# Patient Record
Sex: Female | Born: 1935
Health system: Southern US, Community
[De-identification: ages and names within clinical notes are randomized; demographics above are authoritative.]

## PROBLEM LIST (undated history)

## (undated) DIAGNOSIS — Z8701 Personal history of pneumonia (recurrent): Secondary | ICD-10-CM

## (undated) DIAGNOSIS — H9190 Unspecified hearing loss, unspecified ear: Secondary | ICD-10-CM

## (undated) DIAGNOSIS — I10 Essential (primary) hypertension: Secondary | ICD-10-CM

## (undated) DIAGNOSIS — R200 Anesthesia of skin: Secondary | ICD-10-CM

## (undated) DIAGNOSIS — M1990 Unspecified osteoarthritis, unspecified site: Secondary | ICD-10-CM

## (undated) DIAGNOSIS — R32 Unspecified urinary incontinence: Secondary | ICD-10-CM

## (undated) DIAGNOSIS — C44712 Basal cell carcinoma of skin of right lower limb, including hip: Secondary | ICD-10-CM

## (undated) DIAGNOSIS — Z8709 Personal history of other diseases of the respiratory system: Secondary | ICD-10-CM

## (undated) HISTORY — PX: BASAL CELL CARCINOMA EXCISION: SHX1214

## (undated) HISTORY — DX: Anesthesia of skin: R20.0

## (undated) HISTORY — PX: TUBAL LIGATION: SHX77

---

## 2015-06-07 DIAGNOSIS — J069 Acute upper respiratory infection, unspecified: Secondary | ICD-10-CM | POA: Diagnosis not present

## 2015-07-02 DIAGNOSIS — J209 Acute bronchitis, unspecified: Secondary | ICD-10-CM | POA: Diagnosis not present

## 2015-07-30 DIAGNOSIS — I10 Essential (primary) hypertension: Secondary | ICD-10-CM | POA: Diagnosis not present

## 2015-07-30 DIAGNOSIS — M419 Scoliosis, unspecified: Secondary | ICD-10-CM | POA: Diagnosis not present

## 2015-07-30 DIAGNOSIS — H9190 Unspecified hearing loss, unspecified ear: Secondary | ICD-10-CM | POA: Diagnosis not present

## 2015-07-30 DIAGNOSIS — C4490 Unspecified malignant neoplasm of skin, unspecified: Secondary | ICD-10-CM | POA: Diagnosis not present

## 2015-07-30 DIAGNOSIS — M25511 Pain in right shoulder: Secondary | ICD-10-CM | POA: Diagnosis not present

## 2015-08-03 DIAGNOSIS — H2513 Age-related nuclear cataract, bilateral: Secondary | ICD-10-CM | POA: Diagnosis not present

## 2015-08-04 DIAGNOSIS — Z1283 Encounter for screening for malignant neoplasm of skin: Secondary | ICD-10-CM | POA: Diagnosis not present

## 2015-08-04 DIAGNOSIS — C44722 Squamous cell carcinoma of skin of right lower limb, including hip: Secondary | ICD-10-CM | POA: Diagnosis not present

## 2015-08-04 DIAGNOSIS — L821 Other seborrheic keratosis: Secondary | ICD-10-CM | POA: Diagnosis not present

## 2015-08-19 DIAGNOSIS — M25521 Pain in right elbow: Secondary | ICD-10-CM | POA: Diagnosis not present

## 2015-08-19 DIAGNOSIS — M19011 Primary osteoarthritis, right shoulder: Secondary | ICD-10-CM | POA: Diagnosis not present

## 2015-09-11 DIAGNOSIS — Z85828 Personal history of other malignant neoplasm of skin: Secondary | ICD-10-CM | POA: Diagnosis not present

## 2015-09-11 DIAGNOSIS — Z08 Encounter for follow-up examination after completed treatment for malignant neoplasm: Secondary | ICD-10-CM | POA: Diagnosis not present

## 2015-09-11 DIAGNOSIS — L980 Pyogenic granuloma: Secondary | ICD-10-CM | POA: Diagnosis not present

## 2015-11-02 DIAGNOSIS — M25511 Pain in right shoulder: Secondary | ICD-10-CM | POA: Diagnosis not present

## 2015-11-13 DIAGNOSIS — M19011 Primary osteoarthritis, right shoulder: Secondary | ICD-10-CM | POA: Diagnosis not present

## 2015-12-03 ENCOUNTER — Other Ambulatory Visit: Payer: Self-pay | Admitting: Orthopedic Surgery

## 2016-01-04 NOTE — Pre-Procedure Instructions (Signed)
Terry Nunez  01/04/2016      [id number] - [address], [place] [address] [place] [address] Phone: [phone] [phone]    Your procedure is scheduled on Thursday, September 21st, 2017.  Report to [facility] Admitting at 5:30 A.M.   Call this number if you have problems the morning of surgery:  [phone]   Remember:  Do not eat food or drink liquids after midnight.   Take these medicines the morning of surgery with A SIP OF WATER: None.  7 days prior to surgery, stop taking: Aspirin, NSAIDS, Aleve, Naproxen, Ibuprofen, Advil, Motrin, BC's, Goody's, Fish oil, all herbal medications, and all vitamins.    Do not wear jewelry, make-up or nail polish.  Do not wear lotions, powders, or perfumes, or deoderant.  Do not shave 48 hours prior to surgery.    Do not bring valuables to the hospital.  [facility] is not responsible for any belongings or valuables.  Contacts, dentures or bridgework may not be worn into surgery.  Leave your suitcase in the car.  After surgery it may be brought to your room.  For patients admitted to the hospital, discharge time will be determined by your treatment team.  Patients discharged the day of surgery will not be allowed to drive home.   Special instructions:  Preparing for Surgery.   Please read over the following fact sheets that you were given. MRSA Information, Incentive Spirometry   [place]- Preparing For Surgery  Before surgery, you can play an important role. Because skin is not sterile, your skin needs to be as free of germs as possible. You can reduce the number of germs on your skin by washing with CHG (chlorahexidine gluconate) Soap before surgery.  CHG is an antiseptic cleaner which kills germs and bonds with the skin to continue killing germs even after washing.  Please do not use if you have an allergy to CHG or antibacterial soaps. If your skin becomes  reddened/irritated stop using the CHG.  Do not shave (including legs and underarms) for at least 48 hours prior to first CHG shower. It is OK to shave your face.  Please follow these instructions carefully.   1. Shower the NIGHT BEFORE SURGERY and the MORNING OF SURGERY with CHG.   2. If you chose to wash your hair, wash your hair first as usual with your normal shampoo.  3. After you shampoo, rinse your hair and body thoroughly to remove the shampoo.  4. Use CHG as you would any other liquid soap. You can apply CHG directly to the skin and wash gently with a scrungie or a clean washcloth.   5. Apply the CHG Soap to your body ONLY FROM THE NECK DOWN.  Do not use on open wounds or open sores. Avoid contact with your eyes, ears, mouth and genitals (private parts). Wash genitals (private parts) with your normal soap.  6. Wash thoroughly, paying special attention to the area where your surgery will be performed.  7. Thoroughly rinse your body with warm water from the neck down.  8. DO NOT shower/wash with your normal soap after using and rinsing off the CHG Soap.  9. Pat yourself dry with a CLEAN TOWEL.   10. Wear CLEAN PAJAMAS   11. Place CLEAN SHEETS on your bed the night of your first shower and DO NOT SLEEP WITH PETS.  Day of Surgery: Do not apply any deodorants/lotions.  Please wear clean clothes to the hospital/surgery center.

## 2016-01-05 ENCOUNTER — Ambulatory Visit (HOSPITAL_COMMUNITY)
Admission: RE | Admit: 2016-01-05 | Discharge: 2016-01-05 | Disposition: A | Discharge status: Home / Self Care | Payer: Medicare Other | Source: Ambulatory Visit | Attending: Orthopedic Surgery | Admitting: Orthopedic Surgery

## 2016-01-05 ENCOUNTER — Encounter (HOSPITAL_COMMUNITY)
Admission: RE | Admit: 2016-01-05 | Discharge: 2016-01-05 | Disposition: A | Discharge status: Home / Self Care | Payer: Medicare Other | Source: Ambulatory Visit | Attending: Orthopedic Surgery | Admitting: Orthopedic Surgery

## 2016-01-05 ENCOUNTER — Encounter (HOSPITAL_COMMUNITY): Payer: Self-pay | Admitting: General Practice

## 2016-01-05 DIAGNOSIS — I10 Essential (primary) hypertension: Secondary | ICD-10-CM | POA: Insufficient documentation

## 2016-01-05 DIAGNOSIS — Z01812 Encounter for preprocedural laboratory examination: Secondary | ICD-10-CM | POA: Insufficient documentation

## 2016-01-05 DIAGNOSIS — Z01810 Encounter for preprocedural cardiovascular examination: Secondary | ICD-10-CM | POA: Insufficient documentation

## 2016-01-05 DIAGNOSIS — Z01818 Encounter for other preprocedural examination: Secondary | ICD-10-CM | POA: Diagnosis not present

## 2016-01-05 HISTORY — DX: Personal history of pneumonia (recurrent): Z87.01

## 2016-01-05 HISTORY — DX: Unspecified osteoarthritis, unspecified site: M19.90

## 2016-01-05 HISTORY — DX: Personal history of other diseases of the respiratory system: Z87.09

## 2016-01-05 HISTORY — DX: Unspecified hearing loss, unspecified ear: H91.90

## 2016-01-05 HISTORY — DX: Essential (primary) hypertension: I10

## 2016-01-05 HISTORY — DX: Unspecified urinary incontinence: R32

## 2016-01-05 HISTORY — DX: Basal cell carcinoma of skin of right lower limb, including hip: C44.712

## 2016-01-05 LAB — PROTIME-INR
INR: 0.98
Prothrombin Time: 13 seconds (ref 11.4–15.2)

## 2016-01-05 LAB — COMPREHENSIVE METABOLIC PANEL
ALBUMIN: 3.9 g/dL (ref 3.5–5.0)
ALT: 21 U/L (ref 14–54)
ANION GAP: 8 (ref 5–15)
AST: 28 U/L (ref 15–41)
Alkaline Phosphatase: 68 U/L (ref 38–126)
BUN: 18 mg/dL (ref 6–20)
CO2: 25 mmol/L (ref 22–32)
Calcium: 9.5 mg/dL (ref 8.9–10.3)
Chloride: 107 mmol/L (ref 101–111)
Creatinine, Ser: 0.63 mg/dL (ref 0.44–1.00)
GFR calc Af Amer: >60 mL/min (ref >60)
GFR calc non Af Amer: >60 mL/min (ref >60)
GLUCOSE: 90 mg/dL (ref 65–99)
POTASSIUM: 4.4 mmol/L (ref 3.5–5.1)
SODIUM: 140 mmol/L (ref 135–145)
Total Bilirubin: 0.9 mg/dL (ref 0.3–1.2)
Total Protein: 6.8 g/dL (ref 6.5–8.1)

## 2016-01-05 LAB — URINALYSIS, ROUTINE W REFLEX MICROSCOPIC
BILIRUBIN URINE: NEGATIVE
Glucose, UA: NEGATIVE mg/dL
HGB URINE DIPSTICK: NEGATIVE
KETONES UR: NEGATIVE mg/dL
Leukocytes, UA: NEGATIVE
Nitrite: NEGATIVE
PROTEIN: NEGATIVE mg/dL
pH: 6 (ref 5.0–8.0)

## 2016-01-05 LAB — SURGICAL PCR SCREEN
MRSA, PCR: NEGATIVE
STAPHYLOCOCCUS AUREUS: POSITIVE — AB

## 2016-01-05 LAB — CBC WITH DIFFERENTIAL/PLATELET
BASOS ABS: 0 10*3/uL (ref 0.0–0.1)
BASOS PCT: 1 %
EOS ABS: 0.4 10*3/uL (ref 0.0–0.7)
Eosinophils Relative: 5 %
HCT: 48 % — ABNORMAL HIGH (ref 36.0–46.0)
HEMOGLOBIN: 15.5 g/dL — AB (ref 12.0–15.0)
Lymphocytes Relative: 28 %
Lymphs Abs: 2 10*3/uL (ref 0.7–4.0)
MCH: 29.9 pg (ref 26.0–34.0)
MCHC: 32.3 g/dL (ref 30.0–36.0)
MCV: 92.7 fL (ref 78.0–100.0)
MONO ABS: 0.6 10*3/uL (ref 0.1–1.0)
MONOS PCT: 8 %
NEUTROS ABS: 4 10*3/uL (ref 1.7–7.7)
NEUTROS PCT: 58 %
Platelets: 171 10*3/uL (ref 150–400)
RBC: 5.18 MIL/uL — ABNORMAL HIGH (ref 3.87–5.11)
RDW: 13.6 % (ref 11.5–15.5)
WBC: 7 10*3/uL (ref 4.0–10.5)

## 2016-01-05 LAB — TYPE AND SCREEN
ABO/RH(D): A POS
ANTIBODY SCREEN: NEGATIVE

## 2016-01-05 LAB — ABO/RH: ABO/RH(D): A POS

## 2016-01-05 LAB — APTT: APTT: 30 s (ref 24–36)

## 2016-01-05 NOTE — Progress Notes (Signed)
Patient notified of positive PCR and verbalized understanding.  Prescription called to [facility] on [address].

## 2016-01-05 NOTE — Progress Notes (Addendum)
PCP - Dr. Yaakov Guthrie Cardiologist - denies  EKG - 01/05/16    -requested EKG for comparison but patient stated that she does not think she has had an EKG in the past CXR - 01/05/16  Echo/stress test/Cardiac Cath - denies  Patient denies chest pain and shortness of breath at PAT appointment.

## 2016-01-07 NOTE — Progress Notes (Signed)
Anesthesia Chart Review:  Pt is a 82 year old female scheduled for R reverse shoulder arthroplasty on 01/14/2016 with Tania Ade, MD.   PMH includes:  HTN. Never smoker. BMI 26  Medications include: ASA, irbesartan  Preoperative labs reviewed.    CXR 01/05/16: No acute cardiopulmonary disease.  EKG 01/05/16: Sinus bradycardia (57 bpm). Incomplete RBBB. Possible Anterior infarct, age undetermined  Reviewed case with Dr. Kalman Shan. If no changes, I anticipate pt can proceed with surgery as scheduled.   Willeen Cass, FNP-BC [facility]/Anesthesiology Phone: [phone] 01/07/2016 4:50 PM

## 2016-01-13 MED ORDER — CEFAZOLIN SODIUM-DEXTROSE 2-4 GM/100ML-% IV SOLN
2.0000 g | INTRAVENOUS | Status: AC
  Administered 2016-01-14: 2 g via INTRAVENOUS
  Filled 2016-01-13: qty 100

## 2016-01-14 ENCOUNTER — Inpatient Hospital Stay (HOSPITAL_COMMUNITY): Payer: Medicare Other | Admitting: Anesthesiology

## 2016-01-14 ENCOUNTER — Inpatient Hospital Stay (HOSPITAL_COMMUNITY): Payer: Medicare Other

## 2016-01-14 ENCOUNTER — Inpatient Hospital Stay (HOSPITAL_COMMUNITY)
Admission: RE | Admit: 2016-01-14 | Discharge: 2016-01-15 | DRG: 483 | Disposition: A | Discharge status: Home / Self Care | Payer: Medicare Other | Source: Ambulatory Visit | Attending: Orthopedic Surgery | Admitting: Orthopedic Surgery

## 2016-01-14 ENCOUNTER — Encounter (HOSPITAL_COMMUNITY)
Admission: RE | Disposition: A | Discharge status: Home / Self Care | Payer: Self-pay | Source: Ambulatory Visit | Attending: Orthopedic Surgery

## 2016-01-14 ENCOUNTER — Encounter (HOSPITAL_COMMUNITY): Payer: Self-pay | Admitting: Surgery

## 2016-01-14 ENCOUNTER — Inpatient Hospital Stay (HOSPITAL_COMMUNITY): Payer: Medicare Other | Admitting: Emergency Medicine

## 2016-01-14 DIAGNOSIS — Z7982 Long term (current) use of aspirin: Secondary | ICD-10-CM

## 2016-01-14 DIAGNOSIS — G8918 Other acute postprocedural pain: Secondary | ICD-10-CM | POA: Diagnosis not present

## 2016-01-14 DIAGNOSIS — Z96611 Presence of right artificial shoulder joint: Secondary | ICD-10-CM | POA: Diagnosis not present

## 2016-01-14 DIAGNOSIS — Z85828 Personal history of other malignant neoplasm of skin: Secondary | ICD-10-CM

## 2016-01-14 DIAGNOSIS — I10 Essential (primary) hypertension: Secondary | ICD-10-CM | POA: Diagnosis present

## 2016-01-14 DIAGNOSIS — M25511 Pain in right shoulder: Secondary | ICD-10-CM | POA: Diagnosis not present

## 2016-01-14 DIAGNOSIS — M19011 Primary osteoarthritis, right shoulder: Secondary | ICD-10-CM | POA: Diagnosis not present

## 2016-01-14 DIAGNOSIS — H9190 Unspecified hearing loss, unspecified ear: Secondary | ICD-10-CM | POA: Diagnosis not present

## 2016-01-14 DIAGNOSIS — Z471 Aftercare following joint replacement surgery: Secondary | ICD-10-CM | POA: Diagnosis not present

## 2016-01-14 DIAGNOSIS — Z96619 Presence of unspecified artificial shoulder joint: Secondary | ICD-10-CM

## 2016-01-14 DIAGNOSIS — M75121 Complete rotator cuff tear or rupture of right shoulder, not specified as traumatic: Secondary | ICD-10-CM | POA: Diagnosis not present

## 2016-01-14 HISTORY — DX: Presence of unspecified artificial shoulder joint: Z96.619

## 2016-01-14 HISTORY — PX: REVERSE SHOULDER ARTHROPLASTY: SHX5054

## 2016-01-14 SURGERY — ARTHROPLASTY, SHOULDER, TOTAL, REVERSE
Anesthesia: Regional | Site: Shoulder | Laterality: Right

## 2016-01-14 MED ORDER — ACETAMINOPHEN 325 MG PO TABS
650.0000 mg | ORAL_TABLET | Freq: Four times a day (QID) | ORAL | Status: DC | PRN
  Administered 2016-01-14: 650 mg via ORAL

## 2016-01-14 MED ORDER — ROCURONIUM BROMIDE 10 MG/ML (PF) SYRINGE
PREFILLED_SYRINGE | INTRAVENOUS | Status: AC
  Filled 2016-01-14: qty 20

## 2016-01-14 MED ORDER — EPHEDRINE SULFATE 50 MG/ML IJ SOLN
INTRAMUSCULAR | Status: DC | PRN
  Administered 2016-01-14 (×2): 10 mg via INTRAVENOUS

## 2016-01-14 MED ORDER — SODIUM CHLORIDE 0.9 % IR SOLN
Status: DC | PRN
  Administered 2016-01-14: 4000 mL

## 2016-01-14 MED ORDER — ACETAMINOPHEN 650 MG RE SUPP: 650.0000 mg | Freq: Four times a day (QID) | RECTAL | Status: DC | PRN

## 2016-01-14 MED ORDER — FENTANYL CITRATE (PF) 100 MCG/2ML IJ SOLN
INTRAMUSCULAR | Status: DC | PRN
  Administered 2016-01-14 (×2): 50 ug via INTRAVENOUS

## 2016-01-14 MED ORDER — OXYCODONE HCL 5 MG PO TABS
5.0000 mg | ORAL_TABLET | ORAL | Status: DC | PRN
  Administered 2016-01-14: 10 mg via ORAL

## 2016-01-14 MED ORDER — POLYETHYLENE GLYCOL 3350 17 G PO PACK: 17.0000 g | PACK | Freq: Every day | ORAL | Status: DC | PRN

## 2016-01-14 MED ORDER — SUGAMMADEX SODIUM 200 MG/2ML IV SOLN
INTRAVENOUS | Status: AC
  Filled 2016-01-14: qty 2

## 2016-01-14 MED ORDER — DEXTROSE 5 % IV SOLN
INTRAVENOUS | Status: DC | PRN
  Administered 2016-01-14: 08:00:00 via INTRAVENOUS

## 2016-01-14 MED ORDER — FENTANYL CITRATE (PF) 100 MCG/2ML IJ SOLN: 25.0000 ug | INTRAMUSCULAR | Status: DC | PRN

## 2016-01-14 MED ORDER — ONDANSETRON HCL 4 MG/2ML IJ SOLN
INTRAMUSCULAR | Status: AC
  Filled 2016-01-14: qty 2

## 2016-01-14 MED ORDER — DEXAMETHASONE SODIUM PHOSPHATE 10 MG/ML IJ SOLN
INTRAMUSCULAR | Status: DC | PRN
  Administered 2016-01-14: 5 mg via INTRAVENOUS

## 2016-01-14 MED ORDER — DEXAMETHASONE SODIUM PHOSPHATE 10 MG/ML IJ SOLN
INTRAMUSCULAR | Status: AC
  Filled 2016-01-14: qty 1

## 2016-01-14 MED ORDER — MIDAZOLAM HCL 5 MG/5ML IJ SOLN
INTRAMUSCULAR | Status: DC | PRN
  Administered 2016-01-14 (×2): 1 mg via INTRAVENOUS

## 2016-01-14 MED ORDER — FLEET ENEMA 7-19 GM/118ML RE ENEM: 1.0000 | ENEMA | Freq: Once | RECTAL | Status: DC | PRN

## 2016-01-14 MED ORDER — TRANEXAMIC ACID 1000 MG/10ML IV SOLN
1000.0000 mg | INTRAVENOUS | Status: AC
  Administered 2016-01-14: 1000 mg via INTRAVENOUS
  Filled 2016-01-14: qty 10

## 2016-01-14 MED ORDER — ONDANSETRON HCL 4 MG/2ML IJ SOLN
INTRAMUSCULAR | Status: DC | PRN
  Administered 2016-01-14: 4 mg via INTRAVENOUS

## 2016-01-14 MED ORDER — OXYCODONE HCL 5 MG PO TABS: 5.0000 mg | ORAL_TABLET | Freq: Once | ORAL | Status: DC | PRN

## 2016-01-14 MED ORDER — ARTIFICIAL TEARS OP OINT
TOPICAL_OINTMENT | OPHTHALMIC | Status: AC
  Filled 2016-01-14: qty 7

## 2016-01-14 MED ORDER — OXYCODONE HCL 5 MG/5ML PO SOLN: 5.0000 mg | Freq: Once | ORAL | Status: DC | PRN

## 2016-01-14 MED ORDER — OXYCODONE HCL 5 MG PO TABS
ORAL_TABLET | ORAL | Status: AC
  Filled 2016-01-14: qty 2

## 2016-01-14 MED ORDER — ONDANSETRON HCL 4 MG PO TABS: 4.0000 mg | ORAL_TABLET | Freq: Four times a day (QID) | ORAL | Status: DC | PRN

## 2016-01-14 MED ORDER — DOCUSATE SODIUM 100 MG PO CAPS
100.0000 mg | ORAL_CAPSULE | Freq: Two times a day (BID) | ORAL | Status: DC
  Administered 2016-01-15: 100 mg via ORAL
  Filled 2016-01-14 (×2): qty 1

## 2016-01-14 MED ORDER — MORPHINE SULFATE (PF) 2 MG/ML IV SOLN: 1.0000 mg | INTRAVENOUS | Status: DC | PRN

## 2016-01-14 MED ORDER — LIDOCAINE 2% (20 MG/ML) 5 ML SYRINGE
INTRAMUSCULAR | Status: AC
  Filled 2016-01-14: qty 5

## 2016-01-14 MED ORDER — POTASSIUM CHLORIDE IN NACL 20-0.45 MEQ/L-% IV SOLN
INTRAVENOUS | Status: DC
  Filled 2016-01-14 (×2): qty 1000

## 2016-01-14 MED ORDER — ASPIRIN EC 325 MG PO TBEC
325.0000 mg | DELAYED_RELEASE_TABLET | Freq: Every day | ORAL | Status: DC
  Administered 2016-01-15: 325 mg via ORAL
  Filled 2016-01-14: qty 1

## 2016-01-14 MED ORDER — LACTATED RINGERS IV SOLN
INTRAVENOUS | Status: DC | PRN
  Administered 2016-01-14: 07:00:00 via INTRAVENOUS

## 2016-01-14 MED ORDER — MENTHOL 3 MG MT LOZG: 1.0000 | LOZENGE | OROMUCOSAL | Status: DC | PRN

## 2016-01-14 MED ORDER — METOCLOPRAMIDE HCL 5 MG PO TABS
5.0000 mg | ORAL_TABLET | Freq: Three times a day (TID) | ORAL | Status: DC | PRN
  Filled 2016-01-14: qty 2

## 2016-01-14 MED ORDER — MIDAZOLAM HCL 2 MG/2ML IJ SOLN
INTRAMUSCULAR | Status: AC
  Filled 2016-01-14: qty 2

## 2016-01-14 MED ORDER — ALUM & MAG HYDROXIDE-SIMETH 200-200-20 MG/5ML PO SUSP: 30.0000 mL | ORAL | Status: DC | PRN

## 2016-01-14 MED ORDER — PROPOFOL 10 MG/ML IV BOLUS
INTRAVENOUS | Status: AC
  Filled 2016-01-14: qty 40

## 2016-01-14 MED ORDER — PROPOFOL 10 MG/ML IV BOLUS
INTRAVENOUS | Status: DC | PRN
Start: 2016-01-14 — End: 2016-01-14
  Administered 2016-01-14: 100 mg via INTRAVENOUS

## 2016-01-14 MED ORDER — ACETAMINOPHEN 325 MG PO TABS
ORAL_TABLET | ORAL | Status: AC
  Filled 2016-01-14: qty 2

## 2016-01-14 MED ORDER — PHENYLEPHRINE 40 MCG/ML (10ML) SYRINGE FOR IV PUSH (FOR BLOOD PRESSURE SUPPORT)
PREFILLED_SYRINGE | INTRAVENOUS | Status: AC
  Filled 2016-01-14: qty 10

## 2016-01-14 MED ORDER — IRBESARTAN 150 MG PO TABS
150.0000 mg | ORAL_TABLET | Freq: Every day | ORAL | Status: DC
  Administered 2016-01-14 – 2016-01-15 (×2): 150 mg via ORAL
  Filled 2016-01-14 (×2): qty 1

## 2016-01-14 MED ORDER — POVIDONE-IODINE 7.5 % EX SOLN: Freq: Once | CUTANEOUS | Status: DC

## 2016-01-14 MED ORDER — SUGAMMADEX SODIUM 200 MG/2ML IV SOLN
INTRAVENOUS | Status: DC | PRN
  Administered 2016-01-14: 200 mg via INTRAVENOUS

## 2016-01-14 MED ORDER — ONDANSETRON HCL 4 MG/2ML IJ SOLN: 4.0000 mg | Freq: Four times a day (QID) | INTRAMUSCULAR | Status: DC | PRN

## 2016-01-14 MED ORDER — PHENYLEPHRINE HCL 10 MG/ML IJ SOLN
INTRAMUSCULAR | Status: DC | PRN
  Administered 2016-01-14: 10 ug/min via INTRAVENOUS

## 2016-01-14 MED ORDER — DIPHENHYDRAMINE HCL 12.5 MG/5ML PO ELIX
12.5000 mg | ORAL_SOLUTION | ORAL | Status: DC | PRN
Start: 2016-01-14 — End: 2016-01-15
  Filled 2016-01-14: qty 10

## 2016-01-14 MED ORDER — FENTANYL CITRATE (PF) 100 MCG/2ML IJ SOLN
INTRAMUSCULAR | Status: AC
  Filled 2016-01-14: qty 2

## 2016-01-14 MED ORDER — PHENOL 1.4 % MT LIQD: 1.0000 | OROMUCOSAL | Status: DC | PRN

## 2016-01-14 MED ORDER — BISACODYL 5 MG PO TBEC: 5.0000 mg | DELAYED_RELEASE_TABLET | Freq: Every day | ORAL | Status: DC | PRN

## 2016-01-14 MED ORDER — ROCURONIUM BROMIDE 100 MG/10ML IV SOLN
INTRAVENOUS | Status: DC | PRN
  Administered 2016-01-14: 40 mg via INTRAVENOUS

## 2016-01-14 MED ORDER — LIDOCAINE HCL (CARDIAC) 20 MG/ML IV SOLN
INTRAVENOUS | Status: DC | PRN
  Administered 2016-01-14: 80 mg via INTRAVENOUS

## 2016-01-14 MED ORDER — METOCLOPRAMIDE HCL 5 MG/ML IJ SOLN
5.0000 mg | Freq: Three times a day (TID) | INTRAMUSCULAR | Status: DC | PRN
  Filled 2016-01-14: qty 2

## 2016-01-14 MED ORDER — BUPIVACAINE-EPINEPHRINE (PF) 0.5% -1:200000 IJ SOLN
INTRAMUSCULAR | Status: DC | PRN
  Administered 2016-01-14: 30 mL via PERINEURAL

## 2016-01-14 MED ORDER — EPHEDRINE 5 MG/ML INJ
INTRAVENOUS | Status: AC
  Filled 2016-01-14: qty 10

## 2016-01-14 MED ORDER — ARTIFICIAL TEARS OP OINT
TOPICAL_OINTMENT | OPHTHALMIC | Status: DC | PRN
  Administered 2016-01-14: 1 via OPHTHALMIC

## 2016-01-14 MED ORDER — CEFAZOLIN IN D5W 1 GM/50ML IV SOLN
1.0000 g | Freq: Four times a day (QID) | INTRAVENOUS | Status: AC
  Administered 2016-01-14 – 2016-01-15 (×3): 1 g via INTRAVENOUS
  Filled 2016-01-14 (×3): qty 50

## 2016-01-14 MED ORDER — ACETAMINOPHEN 500 MG PO TABS
1000.0000 mg | ORAL_TABLET | Freq: Four times a day (QID) | ORAL | Status: DC
  Administered 2016-01-14 – 2016-01-15 (×3): 1000 mg via ORAL
  Filled 2016-01-14 (×3): qty 2

## 2016-01-14 MED ORDER — SUCCINYLCHOLINE CHLORIDE 200 MG/10ML IV SOSY
PREFILLED_SYRINGE | INTRAVENOUS | Status: AC
  Filled 2016-01-14: qty 10

## 2016-01-14 SURGICAL SUPPLY — 78 items
BASEPLATE P2 COATD GLND 6.5X30 (Shoulder) ×1 IMPLANT
BIT DRILL 5/64X5 DISP (BIT) ×2 IMPLANT
BLADE SAW SAG 73X25 THK (BLADE) ×1
BLADE SAW SGTL 73X25 THK (BLADE) ×1 IMPLANT
BLADE SURG 15 STRL LF DISP TIS (BLADE) ×1 IMPLANT
BLADE SURG 15 STRL SS (BLADE) ×1
BOWL SMART MIX CTS (DISPOSABLE) IMPLANT
CHLORAPREP W/TINT 26ML (MISCELLANEOUS) ×2 IMPLANT
COVER MAYO STAND STRL (DRAPES) IMPLANT
COVER SURGICAL LIGHT HANDLE (MISCELLANEOUS) ×2 IMPLANT
DRAPE INCISE IOBAN 66X45 STRL (DRAPES) ×2 IMPLANT
DRAPE ORTHO SPLIT 77X108 STRL (DRAPES) ×2
DRAPE SURG ORHT 6 SPLT 77X108 (DRAPES) ×2 IMPLANT
DRSG AQUACEL AG ADV 3.5X10 (GAUZE/BANDAGES/DRESSINGS) ×2 IMPLANT
ELECT BLADE 4.0 EZ CLEAN MEGAD (MISCELLANEOUS) ×2
ELECT REM PT RETURN 9FT ADLT (ELECTROSURGICAL) ×2
ELECTRODE BLDE 4.0 EZ CLN MEGD (MISCELLANEOUS) ×1 IMPLANT
ELECTRODE REM PT RTRN 9FT ADLT (ELECTROSURGICAL) ×1 IMPLANT
EVACUATOR 1/8 PVC DRAIN (DRAIN) IMPLANT
GLOVE BIO SURGEON STRL SZ7 (GLOVE) ×2 IMPLANT
GLOVE BIO SURGEON STRL SZ7.5 (GLOVE) ×2 IMPLANT
GLOVE BIOGEL PI IND STRL 7.0 (GLOVE) ×1 IMPLANT
GLOVE BIOGEL PI IND STRL 8 (GLOVE) ×1 IMPLANT
GLOVE BIOGEL PI INDICATOR 7.0 (GLOVE) ×1
GLOVE BIOGEL PI INDICATOR 8 (GLOVE) ×1
GOWN STRL REUS W/ TWL LRG LVL3 (GOWN DISPOSABLE) ×3 IMPLANT
GOWN STRL REUS W/ TWL XL LVL3 (GOWN DISPOSABLE) ×1 IMPLANT
GOWN STRL REUS W/TWL LRG LVL3 (GOWN DISPOSABLE) ×3
GOWN STRL REUS W/TWL XL LVL3 (GOWN DISPOSABLE) ×1
HANDPIECE INTERPULSE COAX TIP (DISPOSABLE) ×1
HOOD PEEL AWAY FLYTE STAYCOOL (MISCELLANEOUS) ×4 IMPLANT
INSERT EPOLY STND HUMERUS+4 32 (Shoulder) ×2 IMPLANT
INSERT EPOLYSTD HUMERUS+4 32 (Shoulder) ×1 IMPLANT
KIT BASIN OR (CUSTOM PROCEDURE TRAY) ×2 IMPLANT
KIT ROOM TURNOVER OR (KITS) ×2 IMPLANT
MANIFOLD NEPTUNE II (INSTRUMENTS) ×2 IMPLANT
NEEDLE HYPO 25GX1X1/2 BEV (NEEDLE) IMPLANT
NEEDLE MAYO TROCAR (NEEDLE) IMPLANT
NOZZLE PRISM 8.5MM (MISCELLANEOUS) IMPLANT
NS IRRIG 1000ML POUR BTL (IV SOLUTION) ×2 IMPLANT
P2 COATDE GLNOID BSEPLT 6.5X30 (Shoulder) ×2 IMPLANT
PACK SHOULDER (CUSTOM PROCEDURE TRAY) ×2 IMPLANT
PAD ARMBOARD 7.5X6 YLW CONV (MISCELLANEOUS) ×4 IMPLANT
RESTRAINT HEAD UNIVERSAL NS (MISCELLANEOUS) ×2 IMPLANT
RETRIEVER SUT HEWSON (MISCELLANEOUS) ×2 IMPLANT
SCREW BONE LOCKING RSP 5.0X14 (Screw) ×2 IMPLANT
SCREW BONE LOCKING RSP 5.0X30 (Screw) ×2 IMPLANT
SCREW BONE RSP LOCK 5X14 (Screw) ×1 IMPLANT
SCREW BONE RSP LOCK 5X18 (Screw) ×1 IMPLANT
SCREW BONE RSP LOCK 5X26 (Screw) ×1 IMPLANT
SCREW BONE RSP LOCK 5X30 (Screw) ×1 IMPLANT
SCREW BONE RSP LOCKING 18MM LG (Screw) ×2 IMPLANT
SCREW BONE RSP LOCKING 5.0X26 (Screw) ×2 IMPLANT
SCREW RETAIN W/HEAD 4MM OFFSET (Shoulder) ×2 IMPLANT
SET HNDPC FAN SPRY TIP SCT (DISPOSABLE) ×1 IMPLANT
SLING ARM IMMOBILIZER LRG (SOFTGOODS) ×2 IMPLANT
SLING ARM IMMOBILIZER MED (SOFTGOODS) IMPLANT
SPONGE LAP 18X18 X RAY DECT (DISPOSABLE) ×2 IMPLANT
SPONGE LAP 4X18 X RAY DECT (DISPOSABLE) IMPLANT
STEM REV PRIMARY 8X108 (Shoulder) ×2 IMPLANT
STRIP CLOSURE SKIN 1/2X4 (GAUZE/BANDAGES/DRESSINGS) ×2 IMPLANT
SUCTION FRAZIER HANDLE 10FR (MISCELLANEOUS) ×1
SUCTION TUBE FRAZIER 10FR DISP (MISCELLANEOUS) ×1 IMPLANT
SUPPORT WRAP ARM LG (MISCELLANEOUS) ×2 IMPLANT
SUT ETHIBOND NAB CT1 #1 30IN (SUTURE) ×4 IMPLANT
SUT MNCRL AB 4-0 PS2 18 (SUTURE) ×2 IMPLANT
SUT SILK 2 0 TIES 17X18 (SUTURE)
SUT SILK 2-0 18XBRD TIE BLK (SUTURE) IMPLANT
SUT VIC AB 0 CTB1 27 (SUTURE) ×2 IMPLANT
SUT VIC AB 2-0 CT1 27 (SUTURE) ×1
SUT VIC AB 2-0 CT1 TAPERPNT 27 (SUTURE) ×1 IMPLANT
SYR CONTROL 10ML LL (SYRINGE) IMPLANT
SYRINGE TOOMEY DISP (SYRINGE) IMPLANT
TAPE FIBER 2MM 7IN #2 BLUE (SUTURE) IMPLANT
TOWEL OR 17X24 6PK STRL BLUE (TOWEL DISPOSABLE) ×2 IMPLANT
TOWEL OR 17X26 10 PK STRL BLUE (TOWEL DISPOSABLE) ×2 IMPLANT
WATER STERILE IRR 1000ML POUR (IV SOLUTION) ×2 IMPLANT
YANKAUER SUCT BULB TIP NO VENT (SUCTIONS) IMPLANT

## 2016-01-14 NOTE — Anesthesia Postprocedure Evaluation (Deleted)
Anesthesia Post Note  Patient: Terry Nunez  Procedure(s) Performed: Procedure(s) (LRB): REVERSE SHOULDER ARTHROPLASTY (Right)  Anesthesia Post Evaluation  Last Vitals:  Vitals:   01/14/16 1332 01/14/16 1610  BP: 134/77 125/79  Pulse: (!) 58 67  Resp: 20 20  Temp: 36.4 C 36.8 C    Last Pain:  Vitals:   01/14/16 1100  TempSrc:   PainSc: 0-No pain                 Madelynn Malson S

## 2016-01-14 NOTE — Anesthesia Postprocedure Evaluation (Signed)
Anesthesia Post Note  Patient: Terry Nunez  Procedure(s) Performed: Procedure(s) (LRB): REVERSE SHOULDER ARTHROPLASTY (Right)  Patient location during evaluation: PACU Anesthesia Type: General Level of consciousness: awake and alert and patient cooperative Pain management: pain level controlled Vital Signs Assessment: post-procedure vital signs reviewed and stable Respiratory status: spontaneous breathing and respiratory function stable Cardiovascular status: stable Anesthetic complications: no    Last Vitals:  Vitals:   01/14/16 1332 01/14/16 1610  BP: 134/77 125/79  Pulse: (!) 58 67  Resp: 20 20  Temp: 36.4 C 36.8 C    Last Pain:  Vitals:   01/14/16 1100  TempSrc:   PainSc: 0-No pain                 Danee Soller S

## 2016-01-14 NOTE — Anesthesia Preprocedure Evaluation (Signed)
Anesthesia Evaluation  Patient identified by MRN, date of birth, ID band Patient awake    Reviewed: Allergy & Precautions, H&P , NPO status , Patient's Chart, lab work & pertinent test results  Airway Mallampati: II   Neck ROM: full    Dental   Pulmonary neg pulmonary ROS,    breath sounds clear to auscultation       Cardiovascular hypertension,  Rhythm:regular Rate:Normal     Neuro/Psych    GI/Hepatic   Endo/Other    Renal/GU      Musculoskeletal  (+) Arthritis ,   Abdominal   Peds  Hematology   Anesthesia Other Findings   Reproductive/Obstetrics                             Anesthesia Physical Anesthesia Plan  ASA: II  Anesthesia Plan: General and Regional   Post-op Pain Management:  Regional for Post-op pain   Induction: Intravenous  Airway Management Planned: Oral ETT  Additional Equipment:   Intra-op Plan:   Post-operative Plan:   Informed Consent: I have reviewed the patients History and Physical, chart, labs and discussed the procedure including the risks, benefits and alternatives for the proposed anesthesia with the patient or authorized representative who has indicated his/her understanding and acceptance.     Plan Discussed with: CRNA, Anesthesiologist and Surgeon  Anesthesia Plan Comments:         Anesthesia Quick Evaluation

## 2016-01-14 NOTE — Discharge Instructions (Signed)

## 2016-01-14 NOTE — OR Nursing (Signed)
Spoke w/ Danielle PA and pt okay to go to [facility] if needed.

## 2016-01-14 NOTE — Anesthesia Procedure Notes (Signed)
Anesthesia Regional Block:  Interscalene brachial plexus block  Pre-Anesthetic Checklist: ,, timeout performed, Correct Patient, Correct Site, Correct Laterality, Correct Procedure, Correct Position, site marked, Risks and benefits discussed,  Surgical consent,  Pre-op evaluation,  At surgeon's request and post-op pain management  Laterality: Right  Prep: chloraprep       Needles:  Injection technique: Single-shot  Needle Type: Echogenic Stimulator Needle     Needle Length: 5cm 5 cm Needle Gauge: 22 and 22 G    Additional Needles:  Procedures: ultrasound guided (picture in chart) and nerve stimulator Interscalene brachial plexus block  Nerve Stimulator or Paresthesia:  Response: biceps flexion, 0.45 mA,   Additional Responses:   Narrative:  Start time: 01/14/2016 7:02 AM End time: 01/14/2016 7:13 AM Injection made incrementally with aspirations every 5 mL.  Performed by: Personally  Anesthesiologist: Quinton Voth  Additional Notes: Functioning IV was confirmed and monitors were applied.  A 24mm 22ga Arrow echogenic stimulator needle was used. Sterile prep and drape,hand hygiene and sterile gloves were used.  Negative aspiration and negative test dose prior to incremental administration of local anesthetic. The patient tolerated the procedure well.  Ultrasound guidance: relevent anatomy identified, needle position confirmed, local anesthetic spread visualized around nerve(s), vascular puncture avoided.  Image printed for medical record.

## 2016-01-14 NOTE — H&P (Signed)
Terry Nunez is an 82 y.o. female.   Chief Complaint: R shoulder pain and dysfunction HPI: R shoulder endstage arthritis with significant rotator cuff pathology.  Failed nonoperative treatment.  Past Medical History:  Diagnosis Date  . Arthritis   . Basal cell carcinoma of right lower leg   . Hard of hearing   . History of bronchitis   . History of pneumonia   . Hypertension   . Urinary incontinence    "very little"    Past Surgical History:  Procedure Laterality Date  . BASAL CELL CARCINOMA EXCISION     right leg  . TUBAL LIGATION      History reviewed. No pertinent family history. Social History:  reports that she has never smoked. She has never used smokeless tobacco. She reports that she drinks alcohol. She reports that she does not use drugs.  Allergies:  Allergies  Allergen Reactions  . Aleve [Naproxen Sodium]     "causes my heart rate to drop"  . Codeine Rash    Medications Prior to Admission  Medication Sig Dispense Refill  . aspirin EC 81 MG tablet Take 81 mg by mouth daily.    . irbesartan (AVAPRO) 150 MG tablet Take 150 mg by mouth daily.    . Multiple Vitamin (MULTIVITAMIN WITH MINERALS) TABS tablet Take 1 tablet by mouth daily.    . Omega-3 Fatty Acids (FISH OIL PO) Take 1 capsule by mouth daily.      No results found for this or any previous visit (from the past 48 hour(s)). No results found.  Review of Systems  All other systems reviewed and are negative.   Blood pressure 131/73, pulse (!) 59, temperature 98.3 F (36.8 C), temperature source Oral, resp. rate 20, height 5' 2.5" (1.588 m), weight 65.3 kg (144 lb), SpO2 95 %. Physical Exam  Constitutional: She is oriented to person, place, and time. She appears well-developed and well-nourished.  HENT:  Head: Atraumatic.  Eyes: EOM are normal.  Cardiovascular: Intact distal pulses.   Respiratory: Effort normal.  Musculoskeletal:  R shoulder pain with limited ROM.  Neurological: She is alert  and oriented to person, place, and time.  Skin: Skin is warm and dry.  Psychiatric: She has a normal mood and affect.     Assessment/Plan R shoulder endstage arthritis with significant rotator cuff pathology.  Failed nonoperative treatment. Plan R reverse TSA Risks / benefits of surgery discussed Consent on chart  NPO for OR Preop antibiotics   Nita Sells, MD 01/14/2016, 7:15 AM

## 2016-01-14 NOTE — Transfer of Care (Signed)
Immediate Anesthesia Transfer of Care Note  Patient: Terry Nunez  Procedure(s) Performed: Procedure(s) with comments: REVERSE SHOULDER ARTHROPLASTY (Right) - Right reverse shoulder arthroplasty  Patient Location: PACU  Anesthesia Type:GA combined with regional for post-op pain  Level of Consciousness: awake, oriented, sedated, patient cooperative and responds to stimulation  Airway & Oxygen Therapy: Patient Spontanous Breathing and Patient connected to nasal cannula oxygen  Post-op Assessment: Report given to RN, Post -op Vital signs reviewed and stable, Patient moving all extremities and Patient moving all extremities X 4  Post vital signs: Reviewed and stable  Last Vitals:  Vitals:   01/14/16 0600  BP: 131/73  Pulse: (!) 59  Resp: 20  Temp: 36.8 C    Last Pain:  Vitals:   01/14/16 0600  TempSrc: Oral         Complications: No apparent anesthesia complications

## 2016-01-14 NOTE — Anesthesia Procedure Notes (Signed)
Procedure Name: Intubation Date/Time: 01/14/2016 7:43 AM Performed by: Jacquiline Doe A Pre-anesthesia Checklist: Patient identified, Emergency Drugs available, Suction available and Patient being monitored Patient Re-evaluated:Patient Re-evaluated prior to inductionOxygen Delivery Method: Circle System Utilized and Circle system utilized Preoxygenation: Pre-oxygenation with 100% oxygen Intubation Type: IV induction and Cricoid Pressure applied Ventilation: Mask ventilation without difficulty Laryngoscope Size: Mac and 3 Grade View: Grade I Tube type: Oral Tube size: 7.5 mm Number of attempts: 1 Airway Equipment and Method: Stylet and Oral airway Placement Confirmation: ETT inserted through vocal cords under direct vision,  positive ETCO2 and breath sounds checked- equal and bilateral Secured at: 22 cm Tube secured with: Tape Dental Injury: Teeth and Oropharynx as per pre-operative assessment

## 2016-01-14 NOTE — Op Note (Signed)
Procedure(s): REVERSE SHOULDER ARTHROPLASTY Procedure Note  Terry Nunez female 82 y.o. 01/14/2016  Procedure(s) and Anesthesia Type:    * R REVERSE SHOULDER ARTHROPLASTY - Choice   Indications:  82 y.o. female  With endstage right shoulder arthritis with rotator cuff tear. Pain and dysfunction interfered with quality of life and nonoperative treatment with activity modification, NSAIDS and injections failed.     Surgeon: Nita Sells   Assistants: Jeanmarie Hubert PA-C [facility] was present and scrubbed throughout the procedure and was essential in positioning, retraction, exposure, and closure)  Anesthesia: General endotracheal anesthesia with preoperative interscalene block given by the attending anesthesiologist   Procedure Detail  REVERSE SHOULDER ARTHROPLASTY   Estimated Blood Loss:  200 mL         Drains: none  Blood Given: none          Specimens: none        Complications:  * No complications entered in OR log *         Disposition: PACU - hemodynamically stable.         Condition: stable      OPERATIVE FINDINGS:  A DJO Altivate pressfit reverse total shoulder arthroplasty was placed with a  size 8 stem, a 32-4 glenosphere, and a +4-mm poly insert. The base plate  fixation was excellent.  PROCEDURE: The patient was identified in the preoperative holding area  where I personally marked the operative site after verifying site, side,  and procedure with the patient. An interscalene block given by  the attending anesthesiologist in the holding area and the patient was taken back to the operating room where all extremities were  carefully padded in position after general anesthesia was induced. She  was placed in a beach-chair position and the operative upper extremity was  prepped and draped in a standard sterile fashion. An approximately 10-  cm incision was made from the tip of the coracoid process to the center  point of the humerus at the  level of the axilla. Dissection was carried  down through subcutaneous tissues to the level of the cephalic vein  which was taken laterally with the deltoid. The pectoralis major was  retracted medially. The subdeltoid space was developed and the lateral  edge of the conjoined tendon was identified. The undersurface of  conjoined tendon was palpated and the musculocutaneous nerve was not in  the field. Retractor was placed underneath the conjoined and second  retractor was placed lateral into the deltoid. The circumflex humeral  artery and vessels were identified and clamped and coagulated. The  biceps tendon was tenodesed to the upper border of the pectoralis major.  The subscapularis was taken down as a peel with the underlying capsule.  The  joint was then gently externally rotated while the capsule was released  from the humeral neck around to just beyond the 6 o'clock position. At  this point, the joint was dislocated and the humeral head was presented  into the wound. The excessive osteophyte formation was removed with a  large rongeur.  The cutting guide was used to make the appropriate  head cut and the head was saved for potentially bone grafting.  The glenoid was exposed with the arm in an  abducted extended position. The anterior and posterior labrum were  completely excised and the capsule was released circumferentially to  allow for exposure of the glenoid for preparation. The 2.5 mm drill was  placed using the guide in 5-10 inferior angulation and the tap  was then advanced in the same hole. Small and large reamers were then used. The tap was then removed and the Metaglene was then screwed in with excellent purchase.  The peripheral guide was then used to drilled measured and filled peripheral locking screws. The size  32-4  glenosphere was then impacted on the [facility] taper and the central screw was placed. The humerus was then again exposed and the diaphyseal reamers were used  followed by the metaphyseal reamers. The final broach was left in place in the proximal trial was placed. The joint was reduced and with this implant it was felt that soft tissue tensioning was appropriate with excellent stability and excellent range of motion. Therefore, final humeral stem was placed press fit with some medial bone grafting.  And then the trial polyethylene inserts were tested again and the above implant was felt to be the most appropriate for final insertion. The joint was reduced taken through full range of motion and felt to be stable. Soft tissue tension was appropriate.  The joint was then copiously irrigated with pulse  lavage and the wound was then closed. The subscapularis was repaired through bone tunnels.  Skin was closed with 2-0 Vicryl in a deep dermal layer and 4-0  Monocryl for skin closure. Steri-Strips were applied. Sterile  dressings were then applied as well as a sling. The patient was allowed  to awaken from general anesthesia, transferred to stretcher, and taken  to recovery room in stable condition.   POSTOPERATIVE PLAN: The patient will be kept in the hospital postoperatively  for pain control and therapy.

## 2016-01-15 ENCOUNTER — Encounter (HOSPITAL_COMMUNITY): Payer: Self-pay | Admitting: Orthopedic Surgery

## 2016-01-15 LAB — CBC
HCT: 40.1 % (ref 36.0–46.0)
HEMOGLOBIN: 12.8 g/dL (ref 12.0–15.0)
MCH: 29.4 pg (ref 26.0–34.0)
MCHC: 31.9 g/dL (ref 30.0–36.0)
MCV: 92.2 fL (ref 78.0–100.0)
Platelets: 148 10*3/uL — ABNORMAL LOW (ref 150–400)
RBC: 4.35 MIL/uL (ref 3.87–5.11)
RDW: 13.6 % (ref 11.5–15.5)
WBC: 10.5 10*3/uL (ref 4.0–10.5)

## 2016-01-15 LAB — BASIC METABOLIC PANEL
Anion gap: 7 (ref 5–15)
BUN: 11 mg/dL (ref 6–20)
CALCIUM: 8.7 mg/dL — AB (ref 8.9–10.3)
CHLORIDE: 107 mmol/L (ref 101–111)
CO2: 25 mmol/L (ref 22–32)
CREATININE: 0.66 mg/dL (ref 0.44–1.00)
GFR calc non Af Amer: >60 mL/min (ref >60)
Glucose, Bld: 122 mg/dL — ABNORMAL HIGH (ref 65–99)
Potassium: 4.2 mmol/L (ref 3.5–5.1)
SODIUM: 139 mmol/L (ref 135–145)

## 2016-01-15 MED ORDER — OXYCODONE-ACETAMINOPHEN 5-325 MG PO TABS: 1.0000 | ORAL_TABLET | ORAL | 0 refills | Status: DC | PRN

## 2016-01-15 MED ORDER — HYDROCODONE-ACETAMINOPHEN 5-325 MG PO TABS: 1.0000 | ORAL_TABLET | Freq: Four times a day (QID) | ORAL | 0 refills | Status: DC | PRN

## 2016-01-15 MED ORDER — DOCUSATE SODIUM 100 MG PO CAPS: 100.0000 mg | ORAL_CAPSULE | Freq: Three times a day (TID) | ORAL | 0 refills | Status: DC | PRN

## 2016-01-15 NOTE — Evaluation (Signed)
Occupational Therapy Evaluation Patient Details Name: Terry Nunez MRN: [id number] DOB: [birth year] Today's Date: 01/15/2016    History of Present Illness R REVERSE SHOULDER ARTHROPLASTY    Clinical Impression   This 82 y o female admitted and underwent above presents to acute OT with all education complete with pt and family, we will D/C from acute OT.    Follow Up Recommendations  Other (comment) (follow up per surgeon)    Equipment Recommendations  Other (comment) (none)       Precautions / Restrictions Precautions Precautions: Shoulder Type of Shoulder Precautions: No AROM/PROM of shoulder; elbow-wrist-hand exercises yes Shoulder Interventions: Shoulder sling/immobilizer;Off for dressing/bathing/exercises Restrictions Weight Bearing Restrictions: Yes RUE Weight Bearing: Non weight bearing      Mobility Bed Mobility               General bed mobility comments: Pt up in recliner upon arrival  Transfers Overall transfer level: Independent                                         Pertinent Vitals/Pain Pain Assessment: No/denies pain     Hand Dominance Right (but has mainly been a lefty due to pain in right shoulder)   Extremity/Trunk Assessment Upper Extremity Assessment Upper Extremity Assessment: RUE deficits/detail RUE Deficits / Details: shoulder sx this admission (elbow and distally WNL) RUE Coordination: decreased gross motor           Communication Communication Communication: HOH   Cognition Arousal/Alertness: Awake/alert Behavior During Therapy: WFL for tasks assessed/performed Overall Cognitive Status: Within Functional Limits for tasks assessed       Memory: Decreased recall of precautions (kept wanting to use her RUE too much (order an immoblizer sling due to this))                Exercises   Other Exercises Other Exercises: Pt completed 10 reps of elbow flexion/extension, forearm supination/pronation,  wrist flexion/extension, composite finger flexion/extension for RUE   Shoulder Instructions Shoulder Instructions Donning/doffing shirt without moving shoulder: Minimal assistance Method for sponge bathing under operated UE: Minimal assistance Donning/doffing sling/immobilizer: Supervision/safety Correct positioning of sling/immobilizer: Supervision/safety Pendulum exercises (written home exercise program):  (NA) ROM for elbow, wrist and digits of operated UE: Supervision/safety Sling wearing schedule (on at all times/off for ADL's): Independent Proper positioning of operated UE when showering: Caregiver independent with task Dressing change:  (NA) Positioning of UE while sleeping:  (verbalizes understanding)    [place] expects to be discharged to:: Private residence Living Arrangements: Alone Available Help at Discharge: Family;Available 24 hours/day (at least 1 week) Type of Home: House       Home Layout: One level               Home Equipment: None          Prior Functioning/Environment Level of Independence:  (Mod I)                 OT Problem List: Decreased range of motion;Impaired UE functional use;Decreased coordination      OT Goals(Current goals can be found in the care plan section) Acute Rehab OT Goals Patient Stated Goal: home today  OT Frequency:                End of Session Equipment Utilized During Treatment:  (sling) Nurse Communication:  (pt ready to go from OT standpoint)  Activity Tolerance: Patient tolerated treatment well Patient left: in chair;with family/visitor present   Time: [id number] OT Time Calculation (min): 33 min Charges:  OT General Charges $OT Visit: 1 Procedure OT Evaluation $OT Eval Moderate Complexity: 1 Procedure OT Treatments $Self Care/Home Management : 8-22 mins  Terry Nunez [id number] 01/15/2016, 12:27 PM

## 2016-01-15 NOTE — Discharge Summary (Signed)
Patient ID: Sophiana Scalisi MRN: [id number] DOB/AGE: [birth year] 80 y.o.  Admit date: 01/14/2016 Discharge date: 01/15/2016  Admission Diagnoses:  Active Problems:   S/p reverse total shoulder arthroplasty   Discharge Diagnoses:  Same  Past Medical History:  Diagnosis Date  . Arthritis   . Basal cell carcinoma of right lower leg   . Hard of hearing   . History of bronchitis   . History of pneumonia   . Hypertension   . Urinary incontinence    "very little"    Surgeries: Procedure(s): REVERSE SHOULDER ARTHROPLASTY on 01/14/2016   Consultants:   Discharged Condition: Improved  Hospital Course: Lakin States is an 82 y.o. female who was admitted 01/14/2016 for operative treatment of right shoulder chronic rotator cuff tear with arthropathy. Patient has severe unremitting pain that affects sleep, daily activities, and work/hobbies. After pre-op clearance the patient was taken to the operating room on 01/14/2016 and underwent  Procedure(s): [place].    Patient was given perioperative antibiotics: Anti-infectives    Start     Dose/Rate Route Frequency Ordered Stop   01/14/16 1400  ceFAZolin (ANCEF) IVPB 1 g/50 mL premix     1 g 100 mL/hr over 30 Minutes Intravenous Every 6 hours 01/14/16 1007 01/15/16 0326   01/14/16 0700  ceFAZolin (ANCEF) IVPB 2g/100 mL premix     2 g 200 mL/hr over 30 Minutes Intravenous To ShortStay Surgical 01/13/16 1308 01/14/16 0800       Patient was given sequential compression devices, early ambulation, and ASA  325 daily to prevent DVT.  Patient benefited maximally from hospital stay and there were no complications.    Recent vital signs: Patient Vitals for the past 24 hrs:  BP Temp Temp src Pulse Resp SpO2  01/15/16 0521 132/76 98 F (36.7 C) Oral 66 18 96 %  01/14/16 2141 137/67 97.9 F (36.6 C) Oral 61 18 95 %  01/14/16 1610 125/79 98.2 F (36.8 C) - 67 20 94 %  01/14/16 1332 134/77 97.6 F (36.4 C) - (!) 58 20 97 %   01/14/16 1331 - - - - - 97 %  01/14/16 1115 113/70 - - (!) 56 19 100 %  01/14/16 1100 - - - (!) 57 (!) 23 100 %  01/14/16 1045 112/69 - - (!) 57 18 99 %  01/14/16 1030 117/72 - - (!) 57 (!) 22 100 %  01/14/16 1015 117/76 - - 60 (!) 21 99 %  01/14/16 1000 126/80 - - 64 (!) 23 98 %  01/14/16 0949 126/74 98 F (36.7 C) - - (!) 25 98 %     Recent laboratory studies:  Recent Labs  01/15/16 0551  WBC 10.5  HGB 12.8  HCT 40.1  PLT 148*  NA 139  K 4.2  CL 107  CO2 25  BUN 11  CREATININE 0.66  GLUCOSE 122*  CALCIUM 8.7*     Discharge Medications:     Medication List    TAKE these medications   aspirin EC 81 MG tablet Take 81 mg by mouth daily.   docusate sodium 100 MG capsule Commonly known as:  COLACE Take 1 capsule (100 mg total) by mouth 3 (three) times daily as needed.   FISH OIL PO Take 1 capsule by mouth daily.   HYDROcodone-acetaminophen 5-325 MG tablet Commonly known as:  NORCO Take 1 tablet by mouth every 6 (six) hours as needed for moderate pain.   irbesartan 150 MG tablet Commonly known as:  AVAPRO Take 150 mg by mouth daily.   multivitamin with minerals Tabs tablet Take 1 tablet by mouth daily.       Diagnostic Studies: Dg Chest 2 View  Result Date: 01/05/2016 CLINICAL DATA:  Pre-op films for right shoulder arthroplasty. No chest complaints. EXAM: CHEST  2 VIEW COMPARISON:  None. FINDINGS: Cardiac silhouette is normal in size. No mediastinal or hilar masses or evidence of adenopathy. Lungs are clear.  No pleural effusion or pneumothorax. Old fracture of the right second rib. Arthropathic changes are noted of the right shoulder. IMPRESSION: No acute cardiopulmonary disease. Electronically Signed   By: Lajean Manes M.D.   On: 01/05/2016 12:04   Dg Shoulder Right Port  Result Date: 01/14/2016 CLINICAL DATA:  Status post reversed total shoulder arthroplasty on the right EXAM: PORTABLE RIGHT SHOULDER COMPARISON:  MR right shoulder November 02, 2015  FINDINGS: Frontal view obtained. Prosthetic components appear well seated. No acute fracture or dislocation. No bony destruction. Soft tissue air is an expected postoperative finding. IMPRESSION: Prosthetic components appear well seated and in anatomic alignment on single frontal view. No evident fracture or dislocation. Electronically Signed   By: Lowella Grip III M.D.   On: 01/14/2016 10:24    Disposition: Final discharge disposition not confirmed  Discharge Instructions    Call MD / Call 911    Complete by:  As directed    If you experience chest pain or shortness of breath, CALL 911 and be transported to the hospital emergency room.  If you develope a fever above 101 F, pus (white drainage) or increased drainage or redness at the wound, or calf pain, call your surgeon's office.   Constipation Prevention    Complete by:  As directed    Drink plenty of fluids.  Prune juice may be helpful.  You may use a stool softener, such as Colace (over the counter) 100 mg twice a day.  Use MiraLax (over the counter) for constipation as needed.   Diet - low sodium heart healthy    Complete by:  As directed    Increase activity slowly as tolerated    Complete by:  As directed       Follow-up Information    Nita Sells, MD. Schedule an appointment as soon as possible for a visit in 2 weeks.   Specialty:  Orthopedic Surgery Contact information: [address] [place] [address] [phone]            Signed: Grier Mitts 01/15/2016, 8:15 AM

## 2016-01-15 NOTE — Progress Notes (Signed)
Orthopedic Tech Progress Note Patient Details:  Terry Nunez 1931/07/18 [id number]  Ortho Devices Type of Ortho Device: Sling immobilizer Ortho Device/Splint Location: rue Ortho Device/Splint Interventions: Application   Gibson Lad 01/15/2016, 10:01 AM

## 2016-01-15 NOTE — Progress Notes (Signed)
  Discharge instructions RX's and follow up appts explained and provided to patient and family verbalized understanding. Patient left floor via wheelchair accompanied by volunteers. No c/o pain or shortness of breath at discharge.   Darren Nodal, Tivis Ringer, RN

## 2016-01-15 NOTE — Progress Notes (Signed)
   PATIENT ID: Terry Nunez   1 Day Post-Op Procedure(s) (LRB): REVERSE SHOULDER ARTHROPLASTY (Right)  Subjective: Reports minimal pain, block has worn off. No complaints or concerns. Okay to go home today.   Objective:  Vitals:   01/14/16 2141 01/15/16 0521  BP: 137/67 132/76  Pulse: 61 66  Resp: 18 18  Temp: 97.9 F (36.6 C) 98 F (36.7 C)     R UE dressing c/d/i Wiggles fingers, distally NVI  Labs:   Recent Labs  01/15/16 0551  HGB 12.8   Recent Labs  01/15/16 0551  WBC 10.5  RBC 4.35  HCT 40.1  PLT 148*   Recent Labs  01/15/16 0551  NA 139  K 4.2  CL 107  CO2 25  BUN 11  CREATININE 0.66  GLUCOSE 122*  CALCIUM 8.7*    Assessment and Plan: 1 day s/p right reverse TSA Sling OT- hand wrist elbow ROM only D/c home today- norco for home pain mgmt, rx in chart Minimize narcotics  Fu with Dr. Tamera Punt in 2 weeks  VTE proph: ASA, SCDs

## 2016-01-29 DIAGNOSIS — M19011 Primary osteoarthritis, right shoulder: Secondary | ICD-10-CM | POA: Diagnosis not present

## 2016-01-29 DIAGNOSIS — Z471 Aftercare following joint replacement surgery: Secondary | ICD-10-CM | POA: Diagnosis not present

## 2016-01-29 DIAGNOSIS — Z96611 Presence of right artificial shoulder joint: Secondary | ICD-10-CM | POA: Diagnosis not present

## 2016-02-26 DIAGNOSIS — M19011 Primary osteoarthritis, right shoulder: Secondary | ICD-10-CM | POA: Diagnosis not present

## 2016-02-26 DIAGNOSIS — Z471 Aftercare following joint replacement surgery: Secondary | ICD-10-CM | POA: Diagnosis not present

## 2016-02-26 DIAGNOSIS — Z96611 Presence of right artificial shoulder joint: Secondary | ICD-10-CM | POA: Diagnosis not present

## 2016-03-01 DIAGNOSIS — Z13820 Encounter for screening for osteoporosis: Secondary | ICD-10-CM | POA: Diagnosis not present

## 2016-03-01 DIAGNOSIS — M25511 Pain in right shoulder: Secondary | ICD-10-CM | POA: Diagnosis not present

## 2016-03-01 DIAGNOSIS — Z23 Encounter for immunization: Secondary | ICD-10-CM | POA: Diagnosis not present

## 2016-03-01 DIAGNOSIS — M419 Scoliosis, unspecified: Secondary | ICD-10-CM | POA: Diagnosis not present

## 2016-03-01 DIAGNOSIS — Z1231 Encounter for screening mammogram for malignant neoplasm of breast: Secondary | ICD-10-CM | POA: Diagnosis not present

## 2016-03-01 DIAGNOSIS — I10 Essential (primary) hypertension: Secondary | ICD-10-CM | POA: Diagnosis not present

## 2016-03-14 DIAGNOSIS — Z1231 Encounter for screening mammogram for malignant neoplasm of breast: Secondary | ICD-10-CM | POA: Diagnosis not present

## 2016-03-14 DIAGNOSIS — M85851 Other specified disorders of bone density and structure, right thigh: Secondary | ICD-10-CM | POA: Diagnosis not present

## 2016-03-14 DIAGNOSIS — M810 Age-related osteoporosis without current pathological fracture: Secondary | ICD-10-CM | POA: Diagnosis not present

## 2016-04-04 DIAGNOSIS — M19011 Primary osteoarthritis, right shoulder: Secondary | ICD-10-CM | POA: Diagnosis not present

## 2016-05-23 DIAGNOSIS — M67441 Ganglion, right hand: Secondary | ICD-10-CM | POA: Diagnosis not present

## 2016-05-23 DIAGNOSIS — M25561 Pain in right knee: Secondary | ICD-10-CM | POA: Diagnosis not present

## 2016-07-04 DIAGNOSIS — R6889 Other general symptoms and signs: Secondary | ICD-10-CM | POA: Diagnosis not present

## 2016-07-06 DIAGNOSIS — R05 Cough: Secondary | ICD-10-CM | POA: Diagnosis not present

## 2016-07-06 DIAGNOSIS — J111 Influenza due to unidentified influenza virus with other respiratory manifestations: Secondary | ICD-10-CM | POA: Diagnosis not present

## 2016-07-06 DIAGNOSIS — J1289 Other viral pneumonia: Secondary | ICD-10-CM | POA: Diagnosis not present

## 2016-07-13 DIAGNOSIS — M25561 Pain in right knee: Secondary | ICD-10-CM | POA: Diagnosis not present

## 2016-07-31 DIAGNOSIS — R509 Fever, unspecified: Secondary | ICD-10-CM | POA: Diagnosis not present

## 2016-07-31 DIAGNOSIS — J069 Acute upper respiratory infection, unspecified: Secondary | ICD-10-CM | POA: Diagnosis not present

## 2016-07-31 DIAGNOSIS — J040 Acute laryngitis: Secondary | ICD-10-CM | POA: Diagnosis not present

## 2016-08-03 DIAGNOSIS — Z471 Aftercare following joint replacement surgery: Secondary | ICD-10-CM | POA: Diagnosis not present

## 2016-08-03 DIAGNOSIS — M19011 Primary osteoarthritis, right shoulder: Secondary | ICD-10-CM | POA: Diagnosis not present

## 2016-08-03 DIAGNOSIS — Z96611 Presence of right artificial shoulder joint: Secondary | ICD-10-CM | POA: Diagnosis not present

## 2016-08-10 DIAGNOSIS — M25561 Pain in right knee: Secondary | ICD-10-CM | POA: Diagnosis not present

## 2016-08-22 DIAGNOSIS — I10 Essential (primary) hypertension: Secondary | ICD-10-CM | POA: Diagnosis not present

## 2016-08-22 DIAGNOSIS — J1100 Influenza due to unidentified influenza virus with unspecified type of pneumonia: Secondary | ICD-10-CM | POA: Diagnosis not present

## 2016-08-22 DIAGNOSIS — R739 Hyperglycemia, unspecified: Secondary | ICD-10-CM | POA: Diagnosis not present

## 2016-08-22 DIAGNOSIS — H9190 Unspecified hearing loss, unspecified ear: Secondary | ICD-10-CM | POA: Diagnosis not present

## 2016-08-22 DIAGNOSIS — M25561 Pain in right knee: Secondary | ICD-10-CM | POA: Diagnosis not present

## 2016-08-25 DIAGNOSIS — R0602 Shortness of breath: Secondary | ICD-10-CM | POA: Diagnosis not present

## 2016-08-30 DIAGNOSIS — H2513 Age-related nuclear cataract, bilateral: Secondary | ICD-10-CM | POA: Diagnosis not present

## 2016-09-05 DIAGNOSIS — Z0000 Encounter for general adult medical examination without abnormal findings: Secondary | ICD-10-CM | POA: Diagnosis not present

## 2016-09-05 DIAGNOSIS — M1711 Unilateral primary osteoarthritis, right knee: Secondary | ICD-10-CM | POA: Diagnosis not present

## 2016-09-05 DIAGNOSIS — H9190 Unspecified hearing loss, unspecified ear: Secondary | ICD-10-CM | POA: Diagnosis not present

## 2016-09-05 DIAGNOSIS — I10 Essential (primary) hypertension: Secondary | ICD-10-CM | POA: Diagnosis not present

## 2016-09-07 DIAGNOSIS — M25561 Pain in right knee: Secondary | ICD-10-CM | POA: Diagnosis not present

## 2016-09-07 DIAGNOSIS — M1711 Unilateral primary osteoarthritis, right knee: Secondary | ICD-10-CM | POA: Diagnosis not present

## 2016-09-07 DIAGNOSIS — S83271A Complex tear of lateral meniscus, current injury, right knee, initial encounter: Secondary | ICD-10-CM | POA: Diagnosis not present

## 2016-11-06 DIAGNOSIS — Z6824 Body mass index (BMI) 24.0-24.9, adult: Secondary | ICD-10-CM | POA: Diagnosis not present

## 2016-11-06 DIAGNOSIS — H6090 Unspecified otitis externa, unspecified ear: Secondary | ICD-10-CM | POA: Diagnosis not present

## 2017-02-15 DIAGNOSIS — Z23 Encounter for immunization: Secondary | ICD-10-CM | POA: Diagnosis not present

## 2017-03-09 DIAGNOSIS — I10 Essential (primary) hypertension: Secondary | ICD-10-CM | POA: Diagnosis not present

## 2017-03-09 DIAGNOSIS — H9193 Unspecified hearing loss, bilateral: Secondary | ICD-10-CM | POA: Diagnosis not present

## 2017-03-09 DIAGNOSIS — M1711 Unilateral primary osteoarthritis, right knee: Secondary | ICD-10-CM | POA: Diagnosis not present

## 2017-03-09 DIAGNOSIS — M810 Age-related osteoporosis without current pathological fracture: Secondary | ICD-10-CM | POA: Diagnosis not present

## 2017-05-18 ENCOUNTER — Ambulatory Visit: Payer: Medicare Other | Admitting: Neurology

## 2017-05-18 ENCOUNTER — Encounter: Payer: Self-pay | Admitting: Neurology

## 2017-05-18 DIAGNOSIS — R269 Unspecified abnormalities of gait and mobility: Secondary | ICD-10-CM | POA: Diagnosis not present

## 2017-05-18 DIAGNOSIS — R202 Paresthesia of skin: Secondary | ICD-10-CM | POA: Diagnosis not present

## 2017-05-18 DIAGNOSIS — G3281 Cerebellar ataxia in diseases classified elsewhere: Secondary | ICD-10-CM

## 2017-05-18 HISTORY — DX: Unspecified abnormalities of gait and mobility: R26.9

## 2017-05-18 HISTORY — DX: Paresthesia of skin: R20.2

## 2017-05-18 NOTE — Progress Notes (Signed)
PATIENT: Terry Nunez DOB: 1931/05/16  Chief Complaint  Patient presents with  . Numbness    She is here to have the numbness in her bilateral feet and hands further evaluated.  Marland Kitchen PCP    Vernie Shanks, MD     HISTORICAL Terry Nunez is a 84 year old female, seen in refer by her primary care Dr. Yaakov Guthrie for evaluation of paresthesia of both hands and feet, initial evaluation was on May 18, 2017.  Reviewed and summarized the referring note, she has past medical history of hypertension, began to notice bilateral feet numbness since 2017, starting at the bottom of her feet, there is no significant pain, shortly afterwards, she also noticed bilateral hands numbness tingling,  She has mild gait abnormality, she contributed to her knee problem, she has frequent nocturia, urinary urgency.  She denies significant low back pain, or neck pain.   REVIEW OF SYSTEMS: Full 14 system review of systems performed and notable only for numbness, joint pain, swelling, snoring, hearing loss  ALLERGIES: Allergies  Allergen Reactions  . Aleve [Naproxen Sodium]     "causes my heart rate to drop"  . Codeine Rash    HOME MEDICATIONS: Current Outpatient Medications  Medication Sig Dispense Refill  . aspirin EC 81 MG tablet Take 81 mg by mouth daily.    . irbesartan (AVAPRO) 150 MG tablet Take 150 mg by mouth daily.    . Multiple Vitamin (MULTIVITAMIN WITH MINERALS) TABS tablet Take 1 tablet by mouth daily.    . Omega-3 Fatty Acids (FISH OIL PO) Take 1 capsule by mouth daily.     No current facility-administered medications for this visit.     PAST MEDICAL HISTORY: Past Medical History:  Diagnosis Date  . Arthritis   . Basal cell carcinoma of right lower leg   . Hard of hearing   . History of bronchitis   . History of pneumonia   . Hypertension   . Numbness   . Urinary incontinence    "very little"    PAST SURGICAL HISTORY: Past Surgical History:  Procedure Laterality  Date  . BASAL CELL CARCINOMA EXCISION     right leg  . REVERSE SHOULDER ARTHROPLASTY Right 01/14/2016   Procedure: REVERSE SHOULDER ARTHROPLASTY;  Surgeon: Tania Ade, MD;  Location: [facility];  Service: Orthopedics;  Laterality: Right;  Right reverse shoulder arthroplasty  . TUBAL LIGATION      FAMILY HISTORY: Family History  Problem Relation Age of Onset  . Stroke Mother   . Stroke Father     SOCIAL HISTORY:  Social History   Socioeconomic History  . Marital status: Widowed    Spouse name: Not on file  . Number of children: 4  . Years of education: college  . Highest education level: Not on file  Social Needs  . Financial resource strain: Not on file  . Food insecurity - worry: Not on file  . Food insecurity - inability: Not on file  . Transportation needs - medical: Not on file  . Transportation needs - non-medical: Not on file  Occupational History  . Occupation: Retired  Tobacco Use  . Smoking status: Never Smoker  . Smokeless tobacco: Never Used  Substance and Sexual Activity  . Alcohol use: Yes    Comment: rarely - socially  . Drug use: No  . Sexual activity: Not on file  Other Topics Concern  . Not on file  Social History Narrative   Lives alone.   2 cups  caffeine daily.   Right-handed.     PHYSICAL EXAM   Vitals:   05/18/17 1400  BP: (!) 143/80  Pulse: 85  Weight: 147 lb (66.7 kg)  Height: 5' 2.5" (1.588 m)    Not recorded      Body mass index is 26.46 kg/m.  PHYSICAL EXAMNIATION:  Gen: NAD, conversant, well nourised, obese, well groomed                     Cardiovascular: Regular rate rhythm, no peripheral edema, warm, nontender. Eyes: Conjunctivae clear without exudates or hemorrhage Neck: Supple, no carotid bruits. Pulmonary: Clear to auscultation bilaterally   NEUROLOGICAL EXAM:  MENTAL STATUS: Speech:    Speech is normal; fluent and spontaneous with normal comprehension.  Cognition:     Orientation to time, place and  person     Normal recent and remote memory     Normal Attention span and concentration     Normal Language, naming, repeating,spontaneous speech     Fund of knowledge   CRANIAL NERVES: CN II: Visual fields are full to confrontation. Fundoscopic exam is normal with sharp discs and no vascular changes. Pupils are round equal and briskly reactive to light. CN III, IV, VI: extraocular movement are normal. No ptosis. CN V: Facial sensation is intact to pinprick in all 3 divisions bilaterally. Corneal responses are intact.  CN VII: Face is symmetric with normal eye closure and smile. CN VIII: Hearing is normal to rubbing fingers CN IX, X: Palate elevates symmetrically. Phonation is normal. CN XI: Head turning and shoulder shrug are intact CN XII: Tongue is midline with normal movements and no atrophy.  MOTOR: There is no pronator drift of out-stretched arms. Muscle bulk and tone are normal. Muscle strength is normal.  REFLEXES: Reflexes are 2+ and symmetric at the biceps, triceps, knees, and ankles. Plantar responses are flexor.  SENSORY: Length dependent decreased to light touch, pinprick, and vibratory sensation at the toes   COORDINATION: Rapid alternating movements and fine finger movements are intact. There is no dysmetria on finger-to-nose and heel-knee-shin.    GAIT/STANCE: She needs pushed up to get up from seated position, wide-based, mildly cautious,  DIAGNOSTIC DATA (LABS, IMAGING, TESTING) - I reviewed patient records, labs, notes, testing and imaging myself where available.   ASSESSMENT AND PLAN  Terry Nunez is a 84 y.o. female   Bilateral upper and lower extremity paresthesia, brisk reflexes, mild gait abnormality  Differentiation diagnosis including peripheral neuropathy with superimposed cervical spondylitic myelopathy.  EMG nerve conduction study  MRI of cervical spine   Marcial Pacas, M.D. Ph.D.  [facility] [address] [place], [place] [address] Ph: [phone] Fax: [phone]  CC: Vernie Shanks, MD

## 2017-06-08 IMAGING — CR DG CHEST 2V
2 series · 2 of 2 positions shown · non-contrast
Comparison: None.

CLINICAL DATA: Pre-op films for right shoulder arthroplasty. No
chest complaints.

EXAM:
CHEST  2 VIEW

[w chest pa]
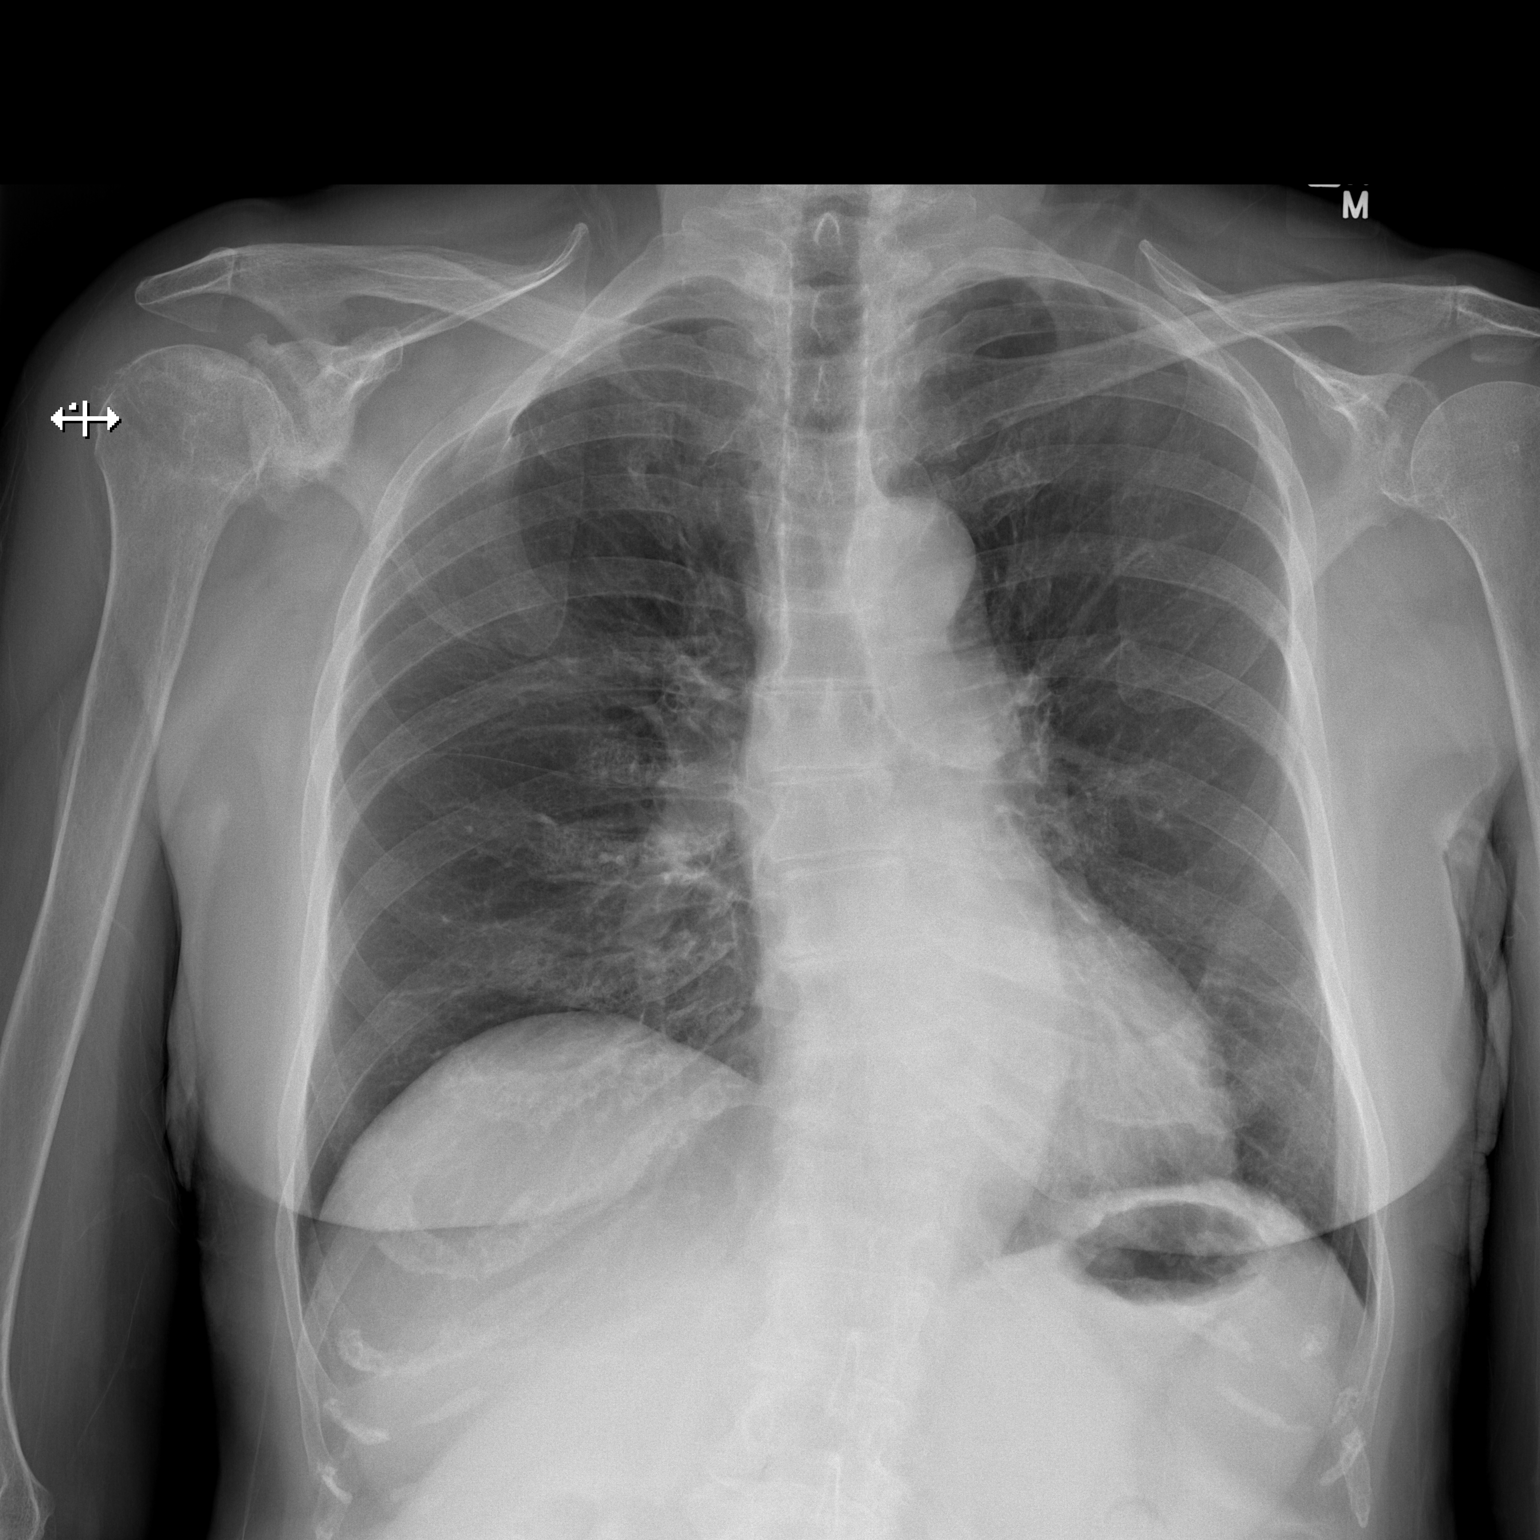

[w chest lat]
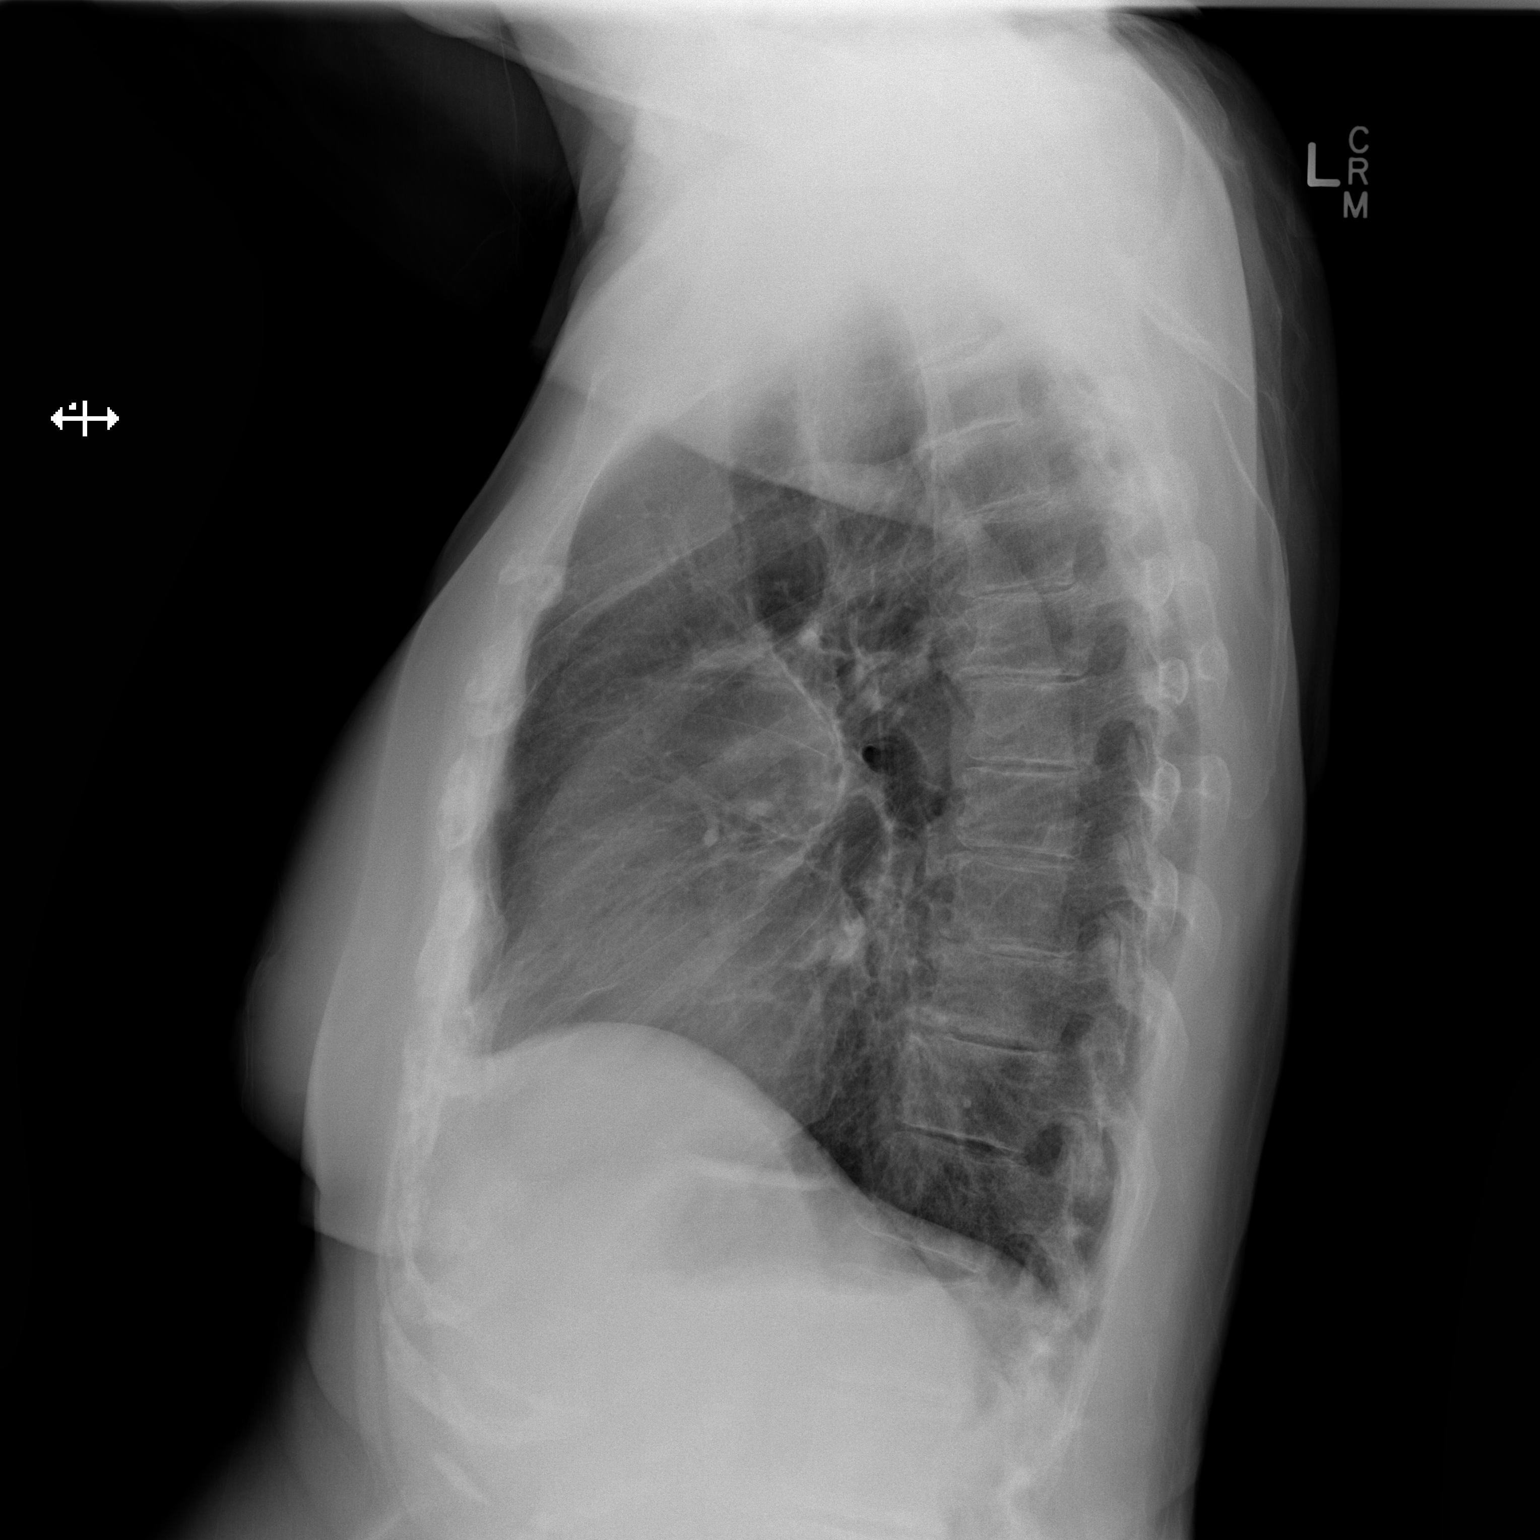

[2 of 2 positions shown; findings below may reference images not displayed]

FINDINGS: Cardiac silhouette is normal in size. No mediastinal or hilar masses
or evidence of adenopathy.

Lungs are clear.  No pleural effusion or pneumothorax.

Old fracture of the right second rib. Arthropathic changes are noted
of the right shoulder.
IMPRESSION: No acute cardiopulmonary disease.

## 2017-06-09 ENCOUNTER — Encounter: Payer: Medicare Other | Admitting: Neurology

## 2017-06-09 DIAGNOSIS — H9190 Unspecified hearing loss, unspecified ear: Secondary | ICD-10-CM | POA: Diagnosis not present

## 2017-06-09 DIAGNOSIS — R7303 Prediabetes: Secondary | ICD-10-CM | POA: Diagnosis not present

## 2017-06-09 DIAGNOSIS — I10 Essential (primary) hypertension: Secondary | ICD-10-CM | POA: Diagnosis not present

## 2017-06-09 DIAGNOSIS — M1711 Unilateral primary osteoarthritis, right knee: Secondary | ICD-10-CM | POA: Diagnosis not present

## 2017-06-27 ENCOUNTER — Ambulatory Visit (INDEPENDENT_AMBULATORY_CARE_PROVIDER_SITE_OTHER): Payer: Medicare Other

## 2017-06-27 ENCOUNTER — Encounter: Payer: Self-pay | Admitting: Podiatry

## 2017-06-27 ENCOUNTER — Ambulatory Visit: Payer: Medicare Other | Admitting: Podiatry

## 2017-06-27 VITALS — BP 152/92 | HR 74

## 2017-06-27 DIAGNOSIS — D3610 Benign neoplasm of peripheral nerves and autonomic nervous system, unspecified: Secondary | ICD-10-CM

## 2017-06-29 DIAGNOSIS — D3610 Benign neoplasm of peripheral nerves and autonomic nervous system, unspecified: Secondary | ICD-10-CM

## 2017-06-29 HISTORY — DX: Benign neoplasm of peripheral nerves and autonomic nervous system, unspecified: D36.10

## 2017-06-29 NOTE — Progress Notes (Signed)
Subjective:   Patient ID: Terry Nunez, female   DOB: 84 y.o.   MRN: [id number]   HPI 84 year old female presents the office today with concerns of pain to her right foot between her third and fourth toes mostly.  She states that it feels like a nerve pain.  She is a previous history of a Morton's neuroma excision on the left foot and she feels that the pain on the right foot is the same as it was before she had that removed from the left foot.  He denies any recent injury or trauma she denies any swelling or redness.  He has been intermittent and ongoing for greater than 6 months.  No recent injury or trauma.  No swelling or redness.  She has no other concerns today.   Review of Systems  All other systems reviewed and are negative.  Past Medical History:  Diagnosis Date  . Arthritis   . Basal cell carcinoma of right lower leg   . Hard of hearing   . History of bronchitis   . History of pneumonia   . Hypertension   . Numbness   . Urinary incontinence    "very little"    Past Surgical History:  Procedure Laterality Date  . BASAL CELL CARCINOMA EXCISION     right leg  . REVERSE SHOULDER ARTHROPLASTY Right 01/14/2016   Procedure: REVERSE SHOULDER ARTHROPLASTY;  Surgeon: Tania Ade, MD;  Location: [facility];  Service: Orthopedics;  Laterality: Right;  Right reverse shoulder arthroplasty  . TUBAL LIGATION       Current Outpatient Medications:  .  aspirin EC 81 MG tablet, Take 81 mg by mouth daily., Disp: , Rfl:  .  irbesartan (AVAPRO) 150 MG tablet, Take 150 mg by mouth daily., Disp: , Rfl:  .  Multiple Vitamin (MULTIVITAMIN WITH MINERALS) TABS tablet, Take 1 tablet by mouth daily., Disp: , Rfl:  .  Omega-3 Fatty Acids (FISH OIL PO), Take 1 capsule by mouth daily., Disp: , Rfl:   Allergies  Allergen Reactions  . Aleve [Naproxen Sodium]     "causes my heart rate to drop"  . Codeine Rash    Social History   Socioeconomic History  . Marital status: Widowed    Spouse name:  Not on file  . Number of children: 4  . Years of education: college  . Highest education level: Not on file  Social Needs  . Financial resource strain: Not on file  . Food insecurity - worry: Not on file  . Food insecurity - inability: Not on file  . Transportation needs - medical: Not on file  . Transportation needs - non-medical: Not on file  Occupational History  . Occupation: Retired  Tobacco Use  . Smoking status: Never Smoker  . Smokeless tobacco: Never Used  Substance and Sexual Activity  . Alcohol use: Yes    Comment: rarely - socially  . Drug use: No  . Sexual activity: Not on file  Other Topics Concern  . Not on file  Social History Narrative   Lives alone.   2 cups caffeine daily.   Right-handed.         Objective:  Physical Exam  General: AAO x3, NAD  Dermatological: Skin is warm, dry and supple bilateral. Nails x 10 are well manicured; remaining integument appears unremarkable at this time. There are no open sores, no preulcerative lesions, no rash or signs of infection present.  Vascular: Dorsalis Pedis artery and Posterior Tibial artery pedal pulses  are 2/4 bilateral with immedate capillary fill time. Pedal hair growth present. No varicosities and no lower extremity edema present bilateral. There is no pain with calf compression, swelling, warmth, erythema.   Neruologic: Grossly intact via light touch bilateral.. Protective threshold with Semmes Wienstein monofilament intact to all pedal sites bilateral.   Musculoskeletal: There is tenderness palpation along the third interspace on the right foot and to a lesser degree the second interspace.  On the third interspace there is a palpable neuroma identified.  Subjectively she does get some numbness to her third and fourth toes.  She describes a sharp pain to her toes as well at times.  There is no area pinpoint bony tenderness or pain to vibratory sensation.  There is no overlying edema, erythema, increase in  warmth.  Muscular strength 5/5 in all groups tested bilateral.  Gait: Unassisted, Nonantalgic.       Assessment:   Likely neuroma right third interspace    Plan:  -Treatment options discussed including all alternatives, risks, and complications -Etiology of symptoms were discussed -X-rays were obtained and reviewed with the patient.  There is no evidence of acute fracture or stress fracture identified today. -Patient requesting steroid injection.  See procedure note below. -Offloading pads.  Discussed shoe modifications.  Discussed other options including inserts.  She states that since he continues to like to have surgery take it out.  Procedure: Injection Tendon/Ligament Discussed alternatives, risks, complications and verbal consent was obtained.  Location: Right plantar fascia at the glabrous junction; medial approach. Skin Prep: Alcohol. Injectate: 0.5 cc 0.5% marcaine plain, 0.5 cc 0.5% Marcaine plain and, 1 cc kenalog 10. Disposition: Patient tolerated procedure well. Injection site dressed with a band-aid.  Post-injection care was discussed and return precautions discussed.    Trula Slade DPM

## 2017-08-29 DIAGNOSIS — H2513 Age-related nuclear cataract, bilateral: Secondary | ICD-10-CM | POA: Diagnosis not present

## 2017-09-05 DIAGNOSIS — L821 Other seborrheic keratosis: Secondary | ICD-10-CM | POA: Diagnosis not present

## 2017-09-05 DIAGNOSIS — X32XXXD Exposure to sunlight, subsequent encounter: Secondary | ICD-10-CM | POA: Diagnosis not present

## 2017-09-05 DIAGNOSIS — Z1283 Encounter for screening for malignant neoplasm of skin: Secondary | ICD-10-CM | POA: Diagnosis not present

## 2017-09-05 DIAGNOSIS — L570 Actinic keratosis: Secondary | ICD-10-CM | POA: Diagnosis not present

## 2017-11-30 DIAGNOSIS — M1711 Unilateral primary osteoarthritis, right knee: Secondary | ICD-10-CM | POA: Diagnosis not present

## 2017-11-30 DIAGNOSIS — I10 Essential (primary) hypertension: Secondary | ICD-10-CM | POA: Diagnosis not present

## 2017-11-30 DIAGNOSIS — H9190 Unspecified hearing loss, unspecified ear: Secondary | ICD-10-CM | POA: Diagnosis not present

## 2017-11-30 DIAGNOSIS — R7303 Prediabetes: Secondary | ICD-10-CM | POA: Diagnosis not present

## 2018-01-09 DIAGNOSIS — Z23 Encounter for immunization: Secondary | ICD-10-CM | POA: Diagnosis not present

## 2018-04-04 DIAGNOSIS — L814 Other melanin hyperpigmentation: Secondary | ICD-10-CM | POA: Diagnosis not present

## 2018-04-04 DIAGNOSIS — L820 Inflamed seborrheic keratosis: Secondary | ICD-10-CM | POA: Diagnosis not present

## 2018-04-04 DIAGNOSIS — L821 Other seborrheic keratosis: Secondary | ICD-10-CM | POA: Diagnosis not present

## 2018-04-04 DIAGNOSIS — D485 Neoplasm of uncertain behavior of skin: Secondary | ICD-10-CM | POA: Diagnosis not present

## 2018-04-04 DIAGNOSIS — L818 Other specified disorders of pigmentation: Secondary | ICD-10-CM | POA: Diagnosis not present

## 2018-06-04 DIAGNOSIS — I10 Essential (primary) hypertension: Secondary | ICD-10-CM | POA: Diagnosis not present

## 2018-06-04 DIAGNOSIS — R7303 Prediabetes: Secondary | ICD-10-CM | POA: Diagnosis not present

## 2018-06-04 DIAGNOSIS — H9190 Unspecified hearing loss, unspecified ear: Secondary | ICD-10-CM | POA: Diagnosis not present

## 2018-06-04 DIAGNOSIS — M1711 Unilateral primary osteoarthritis, right knee: Secondary | ICD-10-CM | POA: Diagnosis not present

## 2018-06-25 DIAGNOSIS — I10 Essential (primary) hypertension: Secondary | ICD-10-CM | POA: Diagnosis not present

## 2018-06-25 DIAGNOSIS — M1711 Unilateral primary osteoarthritis, right knee: Secondary | ICD-10-CM | POA: Diagnosis not present

## 2018-06-25 DIAGNOSIS — R7303 Prediabetes: Secondary | ICD-10-CM | POA: Diagnosis not present

## 2018-06-25 DIAGNOSIS — H9190 Unspecified hearing loss, unspecified ear: Secondary | ICD-10-CM | POA: Diagnosis not present

## 2018-12-11 ENCOUNTER — Other Ambulatory Visit: Payer: Self-pay

## 2018-12-11 DIAGNOSIS — Z20822 Contact with and (suspected) exposure to covid-19: Secondary | ICD-10-CM

## 2018-12-12 LAB — NOVEL CORONAVIRUS, NAA: SARS-CoV-2, NAA: NOT DETECTED

## 2019-01-03 DIAGNOSIS — R7303 Prediabetes: Secondary | ICD-10-CM | POA: Diagnosis not present

## 2019-01-03 DIAGNOSIS — M1711 Unilateral primary osteoarthritis, right knee: Secondary | ICD-10-CM | POA: Diagnosis not present

## 2019-01-03 DIAGNOSIS — I10 Essential (primary) hypertension: Secondary | ICD-10-CM | POA: Diagnosis not present

## 2019-01-03 DIAGNOSIS — H9190 Unspecified hearing loss, unspecified ear: Secondary | ICD-10-CM | POA: Diagnosis not present

## 2019-01-23 DIAGNOSIS — H2513 Age-related nuclear cataract, bilateral: Secondary | ICD-10-CM | POA: Diagnosis not present

## 2019-01-30 DIAGNOSIS — H2511 Age-related nuclear cataract, right eye: Secondary | ICD-10-CM | POA: Diagnosis not present

## 2019-01-30 DIAGNOSIS — H2513 Age-related nuclear cataract, bilateral: Secondary | ICD-10-CM | POA: Diagnosis not present

## 2019-02-05 DIAGNOSIS — H2511 Age-related nuclear cataract, right eye: Secondary | ICD-10-CM | POA: Diagnosis not present

## 2019-02-05 DIAGNOSIS — H25811 Combined forms of age-related cataract, right eye: Secondary | ICD-10-CM | POA: Diagnosis not present

## 2019-02-06 DIAGNOSIS — H25042 Posterior subcapsular polar age-related cataract, left eye: Secondary | ICD-10-CM | POA: Diagnosis not present

## 2019-02-06 DIAGNOSIS — H2512 Age-related nuclear cataract, left eye: Secondary | ICD-10-CM | POA: Diagnosis not present

## 2019-02-06 DIAGNOSIS — H25012 Cortical age-related cataract, left eye: Secondary | ICD-10-CM | POA: Diagnosis not present

## 2019-02-12 DIAGNOSIS — H25812 Combined forms of age-related cataract, left eye: Secondary | ICD-10-CM | POA: Diagnosis not present

## 2019-02-12 DIAGNOSIS — H2512 Age-related nuclear cataract, left eye: Secondary | ICD-10-CM | POA: Diagnosis not present

## 2019-02-27 DIAGNOSIS — X32XXXA Exposure to sunlight, initial encounter: Secondary | ICD-10-CM | POA: Diagnosis not present

## 2019-02-27 DIAGNOSIS — D225 Melanocytic nevi of trunk: Secondary | ICD-10-CM | POA: Diagnosis not present

## 2019-02-27 DIAGNOSIS — L570 Actinic keratosis: Secondary | ICD-10-CM | POA: Diagnosis not present

## 2019-02-27 DIAGNOSIS — Z1283 Encounter for screening for malignant neoplasm of skin: Secondary | ICD-10-CM | POA: Diagnosis not present

## 2019-03-05 DIAGNOSIS — H5203 Hypermetropia, bilateral: Secondary | ICD-10-CM | POA: Diagnosis not present

## 2019-10-04 DIAGNOSIS — Z0000 Encounter for general adult medical examination without abnormal findings: Secondary | ICD-10-CM | POA: Diagnosis not present

## 2019-10-04 DIAGNOSIS — H9190 Unspecified hearing loss, unspecified ear: Secondary | ICD-10-CM | POA: Diagnosis not present

## 2019-10-04 DIAGNOSIS — I10 Essential (primary) hypertension: Secondary | ICD-10-CM | POA: Diagnosis not present

## 2019-10-04 DIAGNOSIS — M810 Age-related osteoporosis without current pathological fracture: Secondary | ICD-10-CM | POA: Diagnosis not present

## 2019-11-29 DIAGNOSIS — I208 Other forms of angina pectoris: Secondary | ICD-10-CM | POA: Diagnosis not present

## 2019-11-29 DIAGNOSIS — L749 Eccrine sweat disorder, unspecified: Secondary | ICD-10-CM | POA: Diagnosis not present

## 2019-11-29 DIAGNOSIS — I10 Essential (primary) hypertension: Secondary | ICD-10-CM | POA: Diagnosis not present

## 2019-11-29 DIAGNOSIS — R5383 Other fatigue: Secondary | ICD-10-CM | POA: Diagnosis not present

## 2019-12-05 ENCOUNTER — Ambulatory Visit: Payer: Medicare Other | Admitting: Cardiology

## 2019-12-17 DIAGNOSIS — D225 Melanocytic nevi of trunk: Secondary | ICD-10-CM | POA: Diagnosis not present

## 2019-12-17 DIAGNOSIS — L821 Other seborrheic keratosis: Secondary | ICD-10-CM | POA: Diagnosis not present

## 2019-12-17 DIAGNOSIS — C44712 Basal cell carcinoma of skin of right lower limb, including hip: Secondary | ICD-10-CM | POA: Diagnosis not present

## 2019-12-17 DIAGNOSIS — L820 Inflamed seborrheic keratosis: Secondary | ICD-10-CM | POA: Diagnosis not present

## 2019-12-17 DIAGNOSIS — Z1283 Encounter for screening for malignant neoplasm of skin: Secondary | ICD-10-CM | POA: Diagnosis not present

## 2019-12-27 ENCOUNTER — Encounter: Payer: Self-pay | Admitting: Cardiology

## 2019-12-27 ENCOUNTER — Ambulatory Visit: Payer: Medicare Other | Admitting: Cardiology

## 2019-12-27 ENCOUNTER — Ambulatory Visit (INDEPENDENT_AMBULATORY_CARE_PROVIDER_SITE_OTHER): Payer: Medicare Other

## 2019-12-27 ENCOUNTER — Other Ambulatory Visit: Payer: Self-pay

## 2019-12-27 DIAGNOSIS — R0600 Dyspnea, unspecified: Secondary | ICD-10-CM | POA: Diagnosis not present

## 2019-12-27 DIAGNOSIS — I10 Essential (primary) hypertension: Secondary | ICD-10-CM | POA: Insufficient documentation

## 2019-12-27 DIAGNOSIS — R0609 Other forms of dyspnea: Secondary | ICD-10-CM

## 2019-12-27 DIAGNOSIS — R42 Dizziness and giddiness: Secondary | ICD-10-CM

## 2019-12-27 HISTORY — DX: Dizziness and giddiness: R42

## 2019-12-27 HISTORY — DX: Dyspnea, unspecified: R06.00

## 2019-12-27 HISTORY — DX: Other forms of dyspnea: R06.09

## 2019-12-27 HISTORY — DX: Essential (primary) hypertension: I10

## 2019-12-27 NOTE — Patient Instructions (Signed)
Medication Instructions:  Your physician recommends that you continue on your current medications as directed. Please refer to the Current Medication list given to you today.  *If you need a refill on your cardiac medications before your next appointment, please call your pharmacy*   Lab Work: None.  If you have labs (blood work) drawn today and your tests are completely normal, you will receive your results only by: Marland Kitchen MyChart Message (if you have MyChart) OR . A paper copy in the mail If you have any lab test that is abnormal or we need to change your treatment, we will call you to review the results.   Testing/Procedures: Your physician has requested that you have an echocardiogram. Echocardiography is a painless test that uses sound waves to create images of your heart. It provides your doctor with information about the size and shape of your heart and how well your heart's chambers and valves are working. This procedure takes approximately one hour. There are no restrictions for this procedure.  A zio monitor was ordered today. It will remain on for 7 days. You will then return monitor and event diary in provided box. It takes 1-2 weeks for report to be downloaded and returned to [place]. We will call you with the results. If monitor falls off or has orange flashing light, please call Zio for further instructions.      Follow-Up: At [facility], you and your health needs are our priority.  As part of our continuing mission to provide you with exceptional heart care, we have created designated Provider Care Teams.  These Care Teams include your primary Cardiologist (physician) and Advanced Practice Providers (APPs -  Physician Assistants and Nurse Practitioners) who all work together to provide you with the care you need, when you need it.  We recommend signing up for the patient portal called "MyChart".  Sign up information is provided on this After Visit Summary.  MyChart is used to  connect with patients for Virtual Visits (Telemedicine).  Patients are able to view lab/test results, encounter notes, upcoming appointments, etc.  Non-urgent messages can be sent to your provider as well.   To learn more about what you can do with MyChart, go to [email or web address].    Your next appointment:   3 month(s)  The format for your next appointment:   In Person  Provider:   Jenne Campus, MD   Other Instructions   Echocardiogram An echocardiogram is a procedure that uses painless sound waves (ultrasound) to produce an image of the heart. Images from an echocardiogram can provide important information about:  Signs of coronary artery disease (CAD).  Aneurysm detection. An aneurysm is a weak or damaged part of an artery wall that bulges out from the normal force of blood pumping through the body.  Heart size and shape. Changes in the size or shape of the heart can be associated with certain conditions, including heart failure, aneurysm, and CAD.  Heart muscle function.  Heart valve function.  Signs of a past heart attack.  Fluid buildup around the heart.  Thickening of the heart muscle.  A tumor or infectious growth around the heart valves. Tell a health care provider about:  Any allergies you have.  All medicines you are taking, including vitamins, herbs, eye drops, creams, and over-the-counter medicines.  Any blood disorders you have.  Any surgeries you have had.  Any medical conditions you have.  Whether you are pregnant or may be pregnant. What are the risks?  Generally, this is a safe procedure. However, problems may occur, including:  Allergic reaction to dye (contrast) that may be used during the procedure. What happens before the procedure? No specific preparation is needed. You may eat and drink normally. What happens during the procedure?   An IV tube may be inserted into one of your veins.  You may receive contrast through this  tube. A contrast is an injection that improves the quality of the pictures from your heart.  A gel will be applied to your chest.  A wand-like tool (transducer) will be moved over your chest. The gel will help to transmit the sound waves from the transducer.  The sound waves will harmlessly bounce off of your heart to allow the heart images to be captured in real-time motion. The images will be recorded on a computer. The procedure may vary among health care providers and hospitals. What happens after the procedure?  You may return to your normal, everyday life, including diet, activities, and medicines, unless your health care provider tells you not to do that. Summary  An echocardiogram is a procedure that uses painless sound waves (ultrasound) to produce an image of the heart.  Images from an echocardiogram can provide important information about the size and shape of your heart, heart muscle function, heart valve function, and fluid buildup around your heart.  You do not need to do anything to prepare before this procedure. You may eat and drink normally.  After the echocardiogram is completed, you may return to your normal, everyday life, unless your health care provider tells you not to do that. This information is not intended to replace advice given to you by your health care provider. Make sure you discuss any questions you have with your health care provider. Document Revised: 08/02/2018 Document Reviewed: 05/14/2016 Elsevier Patient Education  [place].

## 2019-12-27 NOTE — Progress Notes (Signed)
Cardiology Consultation:    Date:  12/27/2019   ID:  Terry Nunez, DOB Sep 17, 1931, MRN [id number]  PCP:  Terry Shanks, MD  Cardiologist:  Terry Campus, MD   Referring MD: Terry Shanks, MD   Chief Complaint  Patient presents with  . Abnormal ECG    History of Present Illness:    Terry Nunez is a 86 y.o. female who is being seen today for the evaluation of abnormal EKG at the request of Terry Shanks, MD.  She is a delightful lady with no significant past medical history except for essential hypertension.  What brought her to the office is the fact that she had EKG done showing some abnormality.  I have no copy of this EKG in our EKG today looks normal however her story is much more deep.  She describes episode of sweatiness she said of a blue she can get very sweaty sometimes she can get even dizzy with this when she sit down she usually gets better.  Those episodes happening every few days to few weeks.  And that being on for about 2 years.  She never passed out because of those episodes she did pass out before when she was a young girl.  Overall she is very active she walks every single day.  She is also planning to have a trip to [place] within the next few weeks.  And she is concerned when to make sure everything is fine.  Described to have some exertional shortness of breath, there is no swelling of lower extremities, she never smoked.  All her family members live long time in the [place] she got 1 brother who is 58 years old and another brother who is 69+.  Her mother and father died also at old age. Past Medical History:  Diagnosis Date  . Arthritis   . Basal cell carcinoma of right lower leg   . Gait abnormality 05/18/2017  . Hard of hearing   . History of bronchitis   . History of pneumonia   . Hypertension   . Neuroma 06/29/2017  . Numbness   . Paresthesia 05/18/2017  . S/p reverse total shoulder arthroplasty 01/14/2016  . Urinary incontinence    "very  little"    Past Surgical History:  Procedure Laterality Date  . BASAL CELL CARCINOMA EXCISION     right leg  . REVERSE SHOULDER ARTHROPLASTY Right 01/14/2016   Procedure: REVERSE SHOULDER ARTHROPLASTY;  Surgeon: Tania Ade, MD;  Location: [facility];  Service: Orthopedics;  Laterality: Right;  Right reverse shoulder arthroplasty  . TUBAL LIGATION      Current Medications: Current Meds  Medication Sig  . irbesartan (AVAPRO) 150 MG tablet Take 150 mg by mouth daily.  . Multiple Vitamin (MULTIVITAMIN WITH MINERALS) TABS tablet Take 1 tablet by mouth daily.     Allergies:   Codeine   Social History   Socioeconomic History  . Marital status: Widowed    Spouse name: Not on file  . Number of children: 4  . Years of education: college  . Highest education level: Not on file  Occupational History  . Occupation: Retired  Tobacco Use  . Smoking status: Never Smoker  . Smokeless tobacco: Never Used  Vaping Use  . Vaping Use: Never used  Substance and Sexual Activity  . Alcohol use: Yes    Comment: rarely - socially  . Drug use: No  . Sexual activity: Not on file  Other Topics Concern  . Not  on file  Social History Narrative   Lives alone.   2 cups caffeine daily.   Right-handed.   Social Determinants of Health   Financial Resource Strain:   . Difficulty of Paying Living Expenses: Not on file  Food Insecurity:   . Worried About Charity fundraiser in the Last Year: Not on file  . Ran Out of Food in the Last Year: Not on file  Transportation Needs:   . Lack of Transportation (Medical): Not on file  . Lack of Transportation (Non-Medical): Not on file  Physical Activity:   . Days of Exercise per Week: Not on file  . Minutes of Exercise per Session: Not on file  Stress:   . Feeling of Stress : Not on file  Social Connections:   . Frequency of Communication with Friends and Family: Not on file  . Frequency of Social Gatherings with Friends and Family: Not on file  .  Attends Religious Services: Not on file  . Active Member of Clubs or Organizations: Not on file  . Attends Archivist Meetings: Not on file  . Marital Status: Not on file     Family History: The patient's family history includes Stroke in her father and mother. ROS:   Please see the history of present illness.    All 14 point review of systems negative except as described per history of present illness.  EKGs/Labs/Other Studies Reviewed:    The following studies were reviewed today:   EKG:  EKG is  ordered today.  The ekg ordered today demonstrates normal sinus rhythm, normal P intervals normal QS complex duration morphology  Recent Labs: No results found for requested labs within last 8760 hours.  Recent Lipid Panel No results found for: CHOL, TRIG, HDL, CHOLHDL, VLDL, LDLCALC, LDLDIRECT  Physical Exam:    VS:  BP (!) 150/90 (BP Location: Right Arm, Patient Position: Sitting, Cuff Size: Normal)   Pulse 68   Ht 5' 2.5" (1.588 m)   Wt 134 lb (60.8 kg)   SpO2 95%   BMI 24.12 kg/m     Wt Readings from Last 3 Encounters:  12/27/19 134 lb (60.8 kg)  05/18/17 147 lb (66.7 kg)  01/14/16 144 lb (65.3 kg)     GEN:  Well nourished, well developed in no acute distress HEENT: Normal NECK: No JVD; No carotid bruits LYMPHATICS: No lymphadenopathy CARDIAC: RRR, no murmurs, no rubs, no gallops RESPIRATORY:  Clear to auscultation without rales, wheezing or rhonchi  ABDOMEN: Soft, non-tender, non-distended MUSCULOSKELETAL:  No edema; No deformity  SKIN: Warm and dry NEUROLOGIC:  Alert and oriented x 3 PSYCHIATRIC:  Normal affect   ASSESSMENT:    1. Dizziness   2. Dyspnea on exertion   3. Essential hypertension    PLAN:    In order of problems listed above:  1. Dizziness with sweatiness I will ask you to wear Zio patch to make sure there is no significant arrhythmia.  How are you also part of evaluation she will have an echocardiogram done to assess left ventricle  ejection fraction.  I encouraged her to be active. 2. Dyspnea exertion: Echocardiogram will be done to make sure her left ventricle ejection fraction is normal. 3. Essential hypertension this is first visit in my office her blood pressure is elevated will keep eye on this.  I asked her also to check her blood pressure at home.  Overall I think she is in excellent shape.  She tells me sometimes her  heart rate is 55, more about potentially having some significant bradycardia that could be responsible for her sweatiness and dizziness.   Medication Adjustments/Labs and Tests Ordered: Current medicines are reviewed at length with the patient today.  Concerns regarding medicines are outlined above.  No orders of the defined types were placed in this encounter.  No orders of the defined types were placed in this encounter.   Signed, Park Liter, MD, [facility]. 12/27/2019 10:20 AM    [place]

## 2020-01-10 DIAGNOSIS — R42 Dizziness and giddiness: Secondary | ICD-10-CM | POA: Diagnosis not present

## 2020-01-14 DIAGNOSIS — Z20822 Contact with and (suspected) exposure to covid-19: Secondary | ICD-10-CM | POA: Diagnosis not present

## 2020-01-29 ENCOUNTER — Other Ambulatory Visit: Payer: Self-pay

## 2020-01-29 ENCOUNTER — Ambulatory Visit (HOSPITAL_BASED_OUTPATIENT_CLINIC_OR_DEPARTMENT_OTHER)
Admission: RE | Admit: 2020-01-29 | Discharge: 2020-01-29 | Disposition: A | Discharge status: Home / Self Care | Payer: Medicare Other | Source: Ambulatory Visit | Attending: Cardiology | Admitting: Cardiology

## 2020-01-29 DIAGNOSIS — R0609 Other forms of dyspnea: Secondary | ICD-10-CM

## 2020-01-29 DIAGNOSIS — R0600 Dyspnea, unspecified: Secondary | ICD-10-CM | POA: Diagnosis not present

## 2020-01-29 DIAGNOSIS — R42 Dizziness and giddiness: Secondary | ICD-10-CM

## 2020-02-03 DIAGNOSIS — H3561 Retinal hemorrhage, right eye: Secondary | ICD-10-CM | POA: Diagnosis not present

## 2020-02-03 DIAGNOSIS — Z961 Presence of intraocular lens: Secondary | ICD-10-CM | POA: Diagnosis not present

## 2020-02-03 DIAGNOSIS — H35033 Hypertensive retinopathy, bilateral: Secondary | ICD-10-CM | POA: Diagnosis not present

## 2020-02-03 LAB — ECHOCARDIOGRAM COMPLETE
Area-P 1/2: 4.06 cm2
S' Lateral: 2.87 cm

## 2020-02-04 DIAGNOSIS — Z85828 Personal history of other malignant neoplasm of skin: Secondary | ICD-10-CM | POA: Diagnosis not present

## 2020-02-04 DIAGNOSIS — Z08 Encounter for follow-up examination after completed treatment for malignant neoplasm: Secondary | ICD-10-CM | POA: Diagnosis not present

## 2020-02-06 DIAGNOSIS — Z20822 Contact with and (suspected) exposure to covid-19: Secondary | ICD-10-CM | POA: Diagnosis not present

## 2020-03-16 DIAGNOSIS — Z8709 Personal history of other diseases of the respiratory system: Secondary | ICD-10-CM | POA: Insufficient documentation

## 2020-03-16 DIAGNOSIS — R200 Anesthesia of skin: Secondary | ICD-10-CM | POA: Insufficient documentation

## 2020-03-16 DIAGNOSIS — I10 Essential (primary) hypertension: Secondary | ICD-10-CM | POA: Insufficient documentation

## 2020-03-16 DIAGNOSIS — H9190 Unspecified hearing loss, unspecified ear: Secondary | ICD-10-CM | POA: Insufficient documentation

## 2020-03-16 DIAGNOSIS — Z8701 Personal history of pneumonia (recurrent): Secondary | ICD-10-CM | POA: Insufficient documentation

## 2020-03-16 DIAGNOSIS — R32 Unspecified urinary incontinence: Secondary | ICD-10-CM | POA: Insufficient documentation

## 2020-03-16 DIAGNOSIS — M1990 Unspecified osteoarthritis, unspecified site: Secondary | ICD-10-CM | POA: Insufficient documentation

## 2020-03-16 DIAGNOSIS — C44712 Basal cell carcinoma of skin of right lower limb, including hip: Secondary | ICD-10-CM | POA: Insufficient documentation

## 2020-03-17 ENCOUNTER — Encounter: Payer: Self-pay | Admitting: Cardiology

## 2020-03-17 ENCOUNTER — Other Ambulatory Visit: Payer: Self-pay

## 2020-03-17 ENCOUNTER — Ambulatory Visit: Payer: Medicare Other | Admitting: Cardiology

## 2020-03-17 VITALS — BP 150/94 | HR 62 | Ht 62.5 in | Wt 137.0 lb

## 2020-03-17 DIAGNOSIS — I10 Essential (primary) hypertension: Secondary | ICD-10-CM | POA: Diagnosis not present

## 2020-03-17 DIAGNOSIS — R0609 Other forms of dyspnea: Secondary | ICD-10-CM

## 2020-03-17 DIAGNOSIS — I471 Supraventricular tachycardia, unspecified: Secondary | ICD-10-CM

## 2020-03-17 DIAGNOSIS — R0600 Dyspnea, unspecified: Secondary | ICD-10-CM | POA: Diagnosis not present

## 2020-03-17 HISTORY — DX: Supraventricular tachycardia: I47.1

## 2020-03-17 HISTORY — DX: Supraventricular tachycardia, unspecified: I47.10

## 2020-03-17 NOTE — Patient Instructions (Signed)

## 2020-03-17 NOTE — Progress Notes (Signed)
Cardiology Office Note:    Date:  03/17/2020   ID:  Zyanna Leisinger, DOB 1932/04/19, MRN [id number]  PCP:  Vernie Shanks, MD  Cardiologist:  Jenne Campus, MD    Referring MD: Vernie Shanks, MD   Chief Complaint  Patient presents with  . Follow-up  I am doing fine  History of Present Illness:    Lynia Landry is a 86 y.o. female with past medical history significant for essential hypertension, she was referred to [place] because of episodes of sweatiness.  Quite extensive evaluation has been done which been negative.  The only finding I identified is on the monitor she did have some runs of supraventricular tachycardia which is completely asymptomatic.  She is coming today to my office for follow-up.  She is doing well overall since have seen her last time she went to look twice first time on a cruise second time to [place] in [place], she enjoyed tremendously.  She did not have any difficulty doing it.  She said she got some episode of vertigo when she feels room spins around and actually one time she ended up having vomiting with it.  She was told that this is vertigo and she was explained how to do some maneuvers to help with this which she is trying to do with limited success.  There is no syncope.  Also she presented to me because of unexplained sweatiness so far have no good explanation for it.  Past Medical History:  Diagnosis Date  . Arthritis   . Basal cell carcinoma of right lower leg   . Dizziness 12/27/2019  . Dyspnea on exertion 12/27/2019  . Essential hypertension 12/27/2019  . Gait abnormality 05/18/2017  . Hard of hearing   . History of bronchitis   . History of pneumonia   . Hypertension   . Neuroma 06/29/2017  . Numbness   . Paresthesia 05/18/2017  . S/p reverse total shoulder arthroplasty 01/14/2016  . Urinary incontinence    "very little"    Past Surgical History:  Procedure Laterality Date  . BASAL CELL CARCINOMA EXCISION     right leg  . REVERSE SHOULDER  ARTHROPLASTY Right 01/14/2016   Procedure: REVERSE SHOULDER ARTHROPLASTY;  Surgeon: Tania Ade, MD;  Location: [facility];  Service: Orthopedics;  Laterality: Right;  Right reverse shoulder arthroplasty  . TUBAL LIGATION      Current Medications: Current Meds  Medication Sig  . irbesartan (AVAPRO) 150 MG tablet Take 150 mg by mouth daily.  . Multiple Vitamin (MULTIVITAMIN WITH MINERALS) TABS tablet Take 1 tablet by mouth daily.     Allergies:   Codeine   Social History   Socioeconomic History  . Marital status: Widowed    Spouse name: Not on file  . Number of children: 4  . Years of education: college  . Highest education level: Not on file  Occupational History  . Occupation: Retired  Tobacco Use  . Smoking status: Never Smoker  . Smokeless tobacco: Never Used  Vaping Use  . Vaping Use: Never used  Substance and Sexual Activity  . Alcohol use: Yes    Comment: rarely - socially  . Drug use: No  . Sexual activity: Not on file  Other Topics Concern  . Not on file  Social History Narrative   Lives alone.   2 cups caffeine daily.   Right-handed.   Social Determinants of Health   Financial Resource Strain:   . Difficulty of Paying Living Expenses: Not on file  Food Insecurity:   . Worried About Charity fundraiser in the Last Year: Not on file  . Ran Out of Food in the Last Year: Not on file  Transportation Needs:   . Lack of Transportation (Medical): Not on file  . Lack of Transportation (Non-Medical): Not on file  Physical Activity:   . Days of Exercise per Week: Not on file  . Minutes of Exercise per Session: Not on file  Stress:   . Feeling of Stress : Not on file  Social Connections:   . Frequency of Communication with Friends and Family: Not on file  . Frequency of Social Gatherings with Friends and Family: Not on file  . Attends Religious Services: Not on file  . Active Member of Clubs or Organizations: Not on file  . Attends Archivist  Meetings: Not on file  . Marital Status: Not on file     Family History: The patient's family history includes Stroke in her father and mother. ROS:   Please see the history of present illness.    All 14 point review of systems negative except as described per history of present illness  EKGs/Labs/Other Studies Reviewed:      Recent Labs: No results found for requested labs within last 8760 hours.  Recent Lipid Panel No results found for: CHOL, TRIG, HDL, CHOLHDL, VLDL, LDLCALC, LDLDIRECT  Physical Exam:    VS:  BP (!) 150/94 (BP Location: Right Arm, Patient Position: Sitting, Cuff Size: Normal)   Pulse 62   Ht 5' 2.5" (1.588 m)   Wt 137 lb (62.1 kg)   SpO2 97%   BMI 24.66 kg/m     Wt Readings from Last 3 Encounters:  03/17/20 137 lb (62.1 kg)  12/27/19 134 lb (60.8 kg)  05/18/17 147 lb (66.7 kg)     GEN:  Well nourished, well developed in no acute distress HEENT: Normal NECK: No JVD; No carotid bruits LYMPHATICS: No lymphadenopathy CARDIAC: RRR, no murmurs, no rubs, no gallops RESPIRATORY:  Clear to auscultation without rales, wheezing or rhonchi  ABDOMEN: Soft, non-tender, non-distended MUSCULOSKELETAL:  No edema; No deformity  SKIN: Warm and dry LOWER EXTREMITIES: no swelling NEUROLOGIC:  Alert and oriented x 3 PSYCHIATRIC:  Normal affect   ASSESSMENT:    1. Essential hypertension   2. Dyspnea on exertion   3. Supraventricular tachycardia ([place])    PLAN:    In order of problems listed above:  1. Essential hypertension blood pressure elevated today but she check her blood pressure on the regular basis at home is always good.  We will continue present management.  She thinks she got whitecoat hypertension. 2. Dyspnea on exertion stable doing well.  Lady overall is in excellent shape she walks on the regular basis.  She was skiing until she was 82. 3. Supraventricular tachycardia, asymptomatic, will continue monitoring.  I was offering her some medication  she prefer not to take it.  On top of that she is asymptomatic.  I told her if she develops symptoms she is to let me know.   Medication Adjustments/Labs and Tests Ordered: Current medicines are reviewed at length with the patient today.  Concerns regarding medicines are outlined above.  No orders of the defined types were placed in this encounter.  Medication changes: No orders of the defined types were placed in this encounter.   Signed, Park Liter, MD, [facility] 03/17/2020 9:47 AM    [place]

## 2020-05-13 DIAGNOSIS — H35033 Hypertensive retinopathy, bilateral: Secondary | ICD-10-CM | POA: Diagnosis not present

## 2020-05-13 DIAGNOSIS — H3561 Retinal hemorrhage, right eye: Secondary | ICD-10-CM | POA: Diagnosis not present

## 2020-08-04 DIAGNOSIS — L570 Actinic keratosis: Secondary | ICD-10-CM | POA: Diagnosis not present

## 2020-08-04 DIAGNOSIS — Z85828 Personal history of other malignant neoplasm of skin: Secondary | ICD-10-CM | POA: Diagnosis not present

## 2020-08-04 DIAGNOSIS — X32XXXD Exposure to sunlight, subsequent encounter: Secondary | ICD-10-CM | POA: Diagnosis not present

## 2020-08-04 DIAGNOSIS — Z08 Encounter for follow-up examination after completed treatment for malignant neoplasm: Secondary | ICD-10-CM | POA: Diagnosis not present

## 2020-09-02 ENCOUNTER — Telehealth: Payer: Self-pay | Admitting: Cardiology

## 2020-09-02 NOTE — Telephone Encounter (Signed)
Spoke to the patient just now and she let me know that she feels a twinge in her chest every once in a while. She states that she is not having the "twinge" feeling in her chest at this time.  However her  main issue and the issue she is calling about is the severe sweating that she is having. She tells me that she is not SOB but feels very nervous. She also states that the pads of her fingers feel numb but this is not new and she has felt it for quite some time.   She has not contacted her primary care provider. I advised that she should contact them in regards to the anxiety and sweating that she is experiencing and she states that she will just wait to discuss this with Dr. Agustin Cree next week.  I further advised that she should contact us if she were to develop any chest pain or SOB.   She is not able to take any vital signs for me at this time.

## 2020-09-02 NOTE — Telephone Encounter (Signed)
Pt c/o of Chest Pain: STAT if CP now or developed within 24 hours  1. Are you having CP right now?  Yes   2. Are you experiencing any other symptoms (ex. SOB, nausea, vomiting, sweating)? Severe sweating, SOB  3. How long have you been experiencing CP? Hour and half ago  4. Is your CP continuous or coming and going? Coming and going; feel a twinge  5. Have you taken Nitroglycerin? No  ?

## 2020-09-14 ENCOUNTER — Ambulatory Visit: Payer: Medicare Other | Admitting: Cardiology

## 2020-10-06 DIAGNOSIS — Z6825 Body mass index (BMI) 25.0-25.9, adult: Secondary | ICD-10-CM | POA: Diagnosis not present

## 2020-10-06 DIAGNOSIS — R202 Paresthesia of skin: Secondary | ICD-10-CM | POA: Diagnosis not present

## 2020-10-06 DIAGNOSIS — L749 Eccrine sweat disorder, unspecified: Secondary | ICD-10-CM | POA: Diagnosis not present

## 2020-10-06 DIAGNOSIS — R7303 Prediabetes: Secondary | ICD-10-CM | POA: Diagnosis not present

## 2020-10-06 DIAGNOSIS — Z0000 Encounter for general adult medical examination without abnormal findings: Secondary | ICD-10-CM | POA: Diagnosis not present

## 2020-10-06 DIAGNOSIS — I10 Essential (primary) hypertension: Secondary | ICD-10-CM | POA: Diagnosis not present

## 2020-10-12 ENCOUNTER — Encounter: Payer: Self-pay | Admitting: *Deleted

## 2020-10-12 ENCOUNTER — Other Ambulatory Visit: Payer: Self-pay | Admitting: *Deleted

## 2020-10-12 DIAGNOSIS — R7303 Prediabetes: Secondary | ICD-10-CM

## 2020-10-12 DIAGNOSIS — M67449 Ganglion, unspecified hand: Secondary | ICD-10-CM | POA: Insufficient documentation

## 2020-10-12 DIAGNOSIS — C4490 Unspecified malignant neoplasm of skin, unspecified: Secondary | ICD-10-CM | POA: Insufficient documentation

## 2020-10-12 DIAGNOSIS — M810 Age-related osteoporosis without current pathological fracture: Secondary | ICD-10-CM

## 2020-10-12 DIAGNOSIS — R202 Paresthesia of skin: Secondary | ICD-10-CM

## 2020-10-12 DIAGNOSIS — M1711 Unilateral primary osteoarthritis, right knee: Secondary | ICD-10-CM | POA: Insufficient documentation

## 2020-10-12 DIAGNOSIS — M419 Scoliosis, unspecified: Secondary | ICD-10-CM

## 2020-10-12 DIAGNOSIS — M25519 Pain in unspecified shoulder: Secondary | ICD-10-CM

## 2020-10-12 DIAGNOSIS — R252 Cramp and spasm: Secondary | ICD-10-CM

## 2020-10-12 DIAGNOSIS — M5416 Radiculopathy, lumbar region: Secondary | ICD-10-CM

## 2020-10-12 DIAGNOSIS — M79671 Pain in right foot: Secondary | ICD-10-CM

## 2020-10-12 DIAGNOSIS — I209 Angina pectoris, unspecified: Secondary | ICD-10-CM

## 2020-10-12 HISTORY — DX: Cramp and spasm: R25.2

## 2020-10-12 HISTORY — DX: Pain in right foot: M79.671

## 2020-10-12 HISTORY — DX: Unspecified malignant neoplasm of skin, unspecified: C44.90

## 2020-10-12 HISTORY — DX: Radiculopathy, lumbar region: M54.16

## 2020-10-12 HISTORY — DX: Pain in unspecified shoulder: M25.519

## 2020-10-12 HISTORY — DX: Scoliosis, unspecified: M41.9

## 2020-10-12 HISTORY — DX: Age-related osteoporosis without current pathological fracture: M81.0

## 2020-10-12 HISTORY — DX: Ganglion, unspecified hand: M67.449

## 2020-10-12 HISTORY — DX: Paresthesia of skin: R20.2

## 2020-10-12 HISTORY — DX: Angina pectoris, unspecified: I20.9

## 2020-10-12 HISTORY — DX: Unilateral primary osteoarthritis, right knee: M17.11

## 2020-10-12 HISTORY — DX: Prediabetes: R73.03

## 2020-10-13 ENCOUNTER — Ambulatory Visit: Payer: Medicare Other | Admitting: Cardiology

## 2020-10-13 ENCOUNTER — Encounter: Payer: Self-pay | Admitting: Cardiology

## 2020-10-13 ENCOUNTER — Other Ambulatory Visit: Payer: Self-pay

## 2020-10-13 VITALS — BP 160/68 | HR 78 | Ht 62.75 in | Wt 141.0 lb

## 2020-10-13 DIAGNOSIS — R0609 Other forms of dyspnea: Secondary | ICD-10-CM

## 2020-10-13 DIAGNOSIS — R0600 Dyspnea, unspecified: Secondary | ICD-10-CM

## 2020-10-13 DIAGNOSIS — I471 Supraventricular tachycardia: Secondary | ICD-10-CM

## 2020-10-13 DIAGNOSIS — R200 Anesthesia of skin: Secondary | ICD-10-CM

## 2020-10-13 DIAGNOSIS — I10 Essential (primary) hypertension: Secondary | ICD-10-CM | POA: Diagnosis not present

## 2020-10-13 MED ORDER — METOPROLOL SUCCINATE ER 50 MG PO TB24: 50.0000 mg | ORAL_TABLET | Freq: Every day | ORAL | 3 refills | Status: DC

## 2020-10-13 MED ORDER — METOPROLOL SUCCINATE ER 25 MG PO TB24: 25.0000 mg | ORAL_TABLET | Freq: Every day | ORAL | 3 refills | Status: DC

## 2020-10-13 NOTE — Patient Instructions (Addendum)
Medication Instructions:  Your physician has recommended you make the following change in your medication:  START METOPROLOL SUCCINATE 25MG  ONCE DAILY  *If you need a refill on your cardiac medications before your next appointment, please call your pharmacy*   Lab Work: NONE If you have labs (blood work) drawn today and your tests are completely normal, you will receive your results only by: [place] (if you have MyChart) OR A paper copy in the mail If you have any lab test that is abnormal or we need to change your treatment, we will call you to review the results.    Testing/Procedures: NONE   Follow-Up: At [facility], you and your health needs are our priority.  As part of our continuing mission to provide you with exceptional heart care, we have created designated Provider Care Teams.  These Care Teams include your primary Cardiologist (physician) and Advanced Practice Providers (APPs -  Physician Assistants and Nurse Practitioners) who all work together to provide you with the care you need, when you need it.  We recommend signing up for the patient portal called "MyChart".  Sign up information is provided on this After Visit Summary.  MyChart is used to connect with patients for Virtual Visits (Telemedicine).  Patients are able to view lab/test results, encounter notes, upcoming appointments, etc.  Non-urgent messages can be sent to your provider as well.   To learn more about what you can do with MyChart, go to [email or web address].    Your next appointment:   6 month(s)  The format for your next appointment:   In Person  Provider:   Jenne Campus, MD   Other Instructions Metoprolol Extended-Release Capsules What is this medication? METOPROLOL (me TOE proe lole) treats high blood pressure and heart failure. It may also be used to prevent chest pain (angina). It works by lowering your blood pressure and heart rate, making it easier for your heart to pump  blood to the rest of your body. It belongs to a group of medications called betablockers. This medicine may be used for other purposes; ask your health care provider orpharmacist if you have questions. COMMON BRAND NAME(S): [facility] What should I tell my care team before I take this medication? They need to know if you have any of these conditions: Diabetes Heart disease Liver disease Lung or breathing disease, like asthma Pheochromocytoma Thyroid disease An unusual or allergic reaction to metoprolol, other beta blockers, drugs, foods, dyes, or preservatives Pregnant or trying to get pregnant Breast-feeding How should I use this medication? Take this medication by mouth with water. Take it as directed on the prescription label at the same time every day. Do not cut, crush, or chew this medication. Swallow the capsules whole. You may open the capsule and put the contents in 1 teaspoon of applesauce. Swallow the medication and applesauce right away. Do not chew the medication or applesauce. Keep taking it unlessyour care team tells you to stop. Talk to your care team about the use of this medication in children. While it may be prescribed for children as young as 6 years for selected conditions,precautions do apply. Overdosage: If you think you have taken too much of this medicine contact apoison control center or emergency room at once. NOTE: This medicine is only for you. Do not share this medicine with others. What if I miss a dose? If you miss a dose, take it as soon as you can. If it is almost time for yournext dose, take only  that dose. Do not take double or extra doses. What may interact with this medication? This medication may interact with the following: Certain medications for blood pressure, heart disease, irregular heartbeat Epinephrine Fluoxetine MAOIs like Carbex, Eldepryl, Marplan, Nardil, and Parnate Paroxetine Reserpine This list may not describe all possible  interactions. Give your health care provider a list of all the medicines, herbs, non-prescription drugs, or dietary supplements you use. Also tell them if you smoke, drink alcohol, or use illegaldrugs. Some items may interact with your medicine. What should I watch for while using this medication? Visit your care team for regular checks on your progress. Check your blood pressure as directed. Ask your care team what your blood pressure should be.Also, find out when you should contact them. Do not treat yourself for coughs, colds, or pain while you are using this medication without asking your care team for advice. Some medications mayincrease your blood pressure. You may get drowsy or dizzy. Do not drive, use machinery, or do anything that needs mental alertness until you know how this medication affects you. Do not stand up or sit up quickly, especially if you are an older patient. This reduces the risk of dizzy or fainting spells. Alcohol may interfere with theeffect of this medication. Avoid alcoholic drinks. This medication may increase blood sugar. Ask your care team if changes in dietor medications are needed if you have diabetes. What side effects may I notice from receiving this medication? Side effects that you should report to your care team as soon as possible: Allergic reactions-skin rash, itching, hives, swelling of the face, lips, tongue, or throat Heart failure-shortness of breath, swelling of the ankles, feet, or hands, sudden weight gain, unusual weakness or fatigue Low blood pressure-dizziness, feeling faint or lightheaded, blurry vision Raynaud's-cool, numb, or painful fingers or toes that may change color from pale, to blue, to red Slow heartbeat-dizziness, feeling faint or lightheaded, confusion, trouble breathing, unusual weakness or fatigue Worsening mood, feelings of depression Side effects that usually do not require medical attention (report to your careteam if they continue  or are bothersome): Change in sex drive or performance Diarrhea Dizziness Fatigue Headache This list may not describe all possible side effects. Call your doctor for medical advice about side effects. You may report side effects to FDA at1-800-FDA-1088. Where should I keep my medication? Keep out of the reach of children and pets. Store at room temperature between 20 and 25 degrees C (68 and 77 degrees F).Throw away any unused medication after the expiration date. NOTE: This sheet is a summary. It may not cover all possible information. If you have questions about this medicine, talk to your doctor, pharmacist, orhealth care provider.  2022 Elsevier/Gold Standard (2020-05-14 12:41:51)

## 2020-10-13 NOTE — Progress Notes (Signed)
Cardiology Office Note:    Date:  10/13/2020   ID:  Terry Nunez, DOB Aug 17, 1931, MRN [id number]  PCP:  Vernie Shanks, MD  Cardiologist:  Jenne Campus, MD    Referring MD: Vernie Shanks, MD   Chief Complaint  Patient presents with   hand/finger numbness    Excessive Sweating    History of Present Illness:    Terry Nunez is a 87 y.o. female with a past medical history significant for essential hypertension, she was originally referred to [place] because of episodes of sweatiness.  Quite extensive work-up has been done which is actually unrevealing.  She did wear a monitor monitor showed no significant bradycardia but did does show some episode of supraventricular tachycardia.  She comes today to my office for follow-up overall she seems to be doing well but described to have few issues first of all she complained of having some numbness in the tips of her all fingers.  This is sensation he has almost all the time.  She also still complain of having some of those episodes of sweatiness out of the blue she would become very sweaty there is no dizziness there is no chest pain there is no shortness of breath just profound sweatiness.  She also noted that her heart rate appears to be slightly elevated and she is complaining of having some more palpitations.  There is no dizziness no passing out  Past Medical History:  Diagnosis Date   Age-related osteoporosis without current pathological fracture 10/12/2020   Angina pectoris ([place]) 10/12/2020   Arthritis    Arthritis of right knee 10/12/2020   Basal cell carcinoma of right lower leg    Cramp in lower leg associated with rest 10/12/2020   Dizziness 12/27/2019   Dyspnea on exertion 12/27/2019   Essential hypertension 12/27/2019   Gait abnormality 05/18/2017   Ganglion of hand 10/12/2020   Hard of hearing    History of bronchitis    History of pneumonia    Lumbar radiculopathy 10/12/2020   Neuroma 06/29/2017   Numbness    Pain in  right foot 10/12/2020   Paresthesia 05/18/2017   Paresthesia of both hands 10/12/2020   Prediabetes 10/12/2020   S/p reverse total shoulder arthroplasty 01/14/2016   Scoliosis 10/12/2020   Shoulder joint pain 10/12/2020   Skin cancer 10/12/2020   Supraventricular tachycardia ([place]) 03/17/2020   Urinary incontinence    "very little"    Past Surgical History:  Procedure Laterality Date   BASAL CELL CARCINOMA EXCISION     right leg   REVERSE SHOULDER ARTHROPLASTY Right 01/14/2016   Procedure: REVERSE SHOULDER ARTHROPLASTY;  Surgeon: Tania Ade, MD;  Location: [place] [facility];  Service: Orthopedics;  Laterality: Right;  Right reverse shoulder arthroplasty   TUBAL LIGATION      Current Medications: Current Meds  Medication Sig   hydrochlorothiazide (HYDRODIURIL) 12.5 MG tablet Take 12.5 mg by mouth daily.   irbesartan (AVAPRO) 150 MG tablet Take 150 mg by mouth daily.   Multiple Vitamin (MULTIVITAMIN WITH MINERALS) TABS tablet Take 1 tablet by mouth daily.     Allergies:   Aleve [naproxen] and Codeine   Social History   Socioeconomic History   Marital status: Widowed    Spouse name: Not on file   Number of children: 4   Years of education: college   Highest education level: Not on file  Occupational History   Occupation: Retired  Tobacco Use   Smoking status: Never   Smokeless tobacco: Never  Vaping Use   Vaping Use: Never used  Substance and Sexual Activity   Alcohol use: Yes    Comment: rarely - socially   Drug use: No   Sexual activity: Not on file  Other Topics Concern   Not on file  Social History Narrative   Lives alone.   2 cups caffeine daily.   Right-handed.   Social Determinants of Health   Financial Resource Strain: Not on file  Food Insecurity: Not on file  Transportation Needs: Not on file  Physical Activity: Not on file  Stress: Not on file  Social Connections: Not on file     Family History: The patient's family history includes Stroke in her  father and mother. ROS:   Please see the history of present illness.    All 14 point review of systems negative except as described per history of present illness  EKGs/Labs/Other Studies Reviewed:      Recent Labs: No results found for requested labs within last 8760 hours.  Recent Lipid Panel No results found for: CHOL, TRIG, HDL, CHOLHDL, VLDL, LDLCALC, LDLDIRECT  Physical Exam:    VS:  BP (!) 160/68 (BP Location: Right Arm, Patient Position: Sitting)   Pulse 78   Ht 5' 2.75" (1.594 m)   Wt 141 lb (64 kg)   SpO2 96%   BMI 25.18 kg/m     Wt Readings from Last 3 Encounters:  10/13/20 141 lb (64 kg)  03/17/20 137 lb (62.1 kg)  12/27/19 134 lb (60.8 kg)     GEN:  Well nourished, well developed in no acute distress HEENT: Normal NECK: No JVD; No carotid bruits LYMPHATICS: No lymphadenopathy CARDIAC: RRR, no murmurs, no rubs, no gallops RESPIRATORY:  Clear to auscultation without rales, wheezing or rhonchi  ABDOMEN: Soft, non-tender, non-distended MUSCULOSKELETAL:  No edema; No deformity  SKIN: Warm and dry LOWER EXTREMITIES: no swelling NEUROLOGIC:  Alert and oriented x 3 PSYCHIATRIC:  Normal affect   ASSESSMENT:    1. Supraventricular tachycardia ([place])   2. Essential hypertension   3. Numbness   4. Dyspnea on exertion    PLAN:    In order of problems listed above:  Supraventricular tachycardia she accepted my offer to start a small dose of beta-blocker to help with supraventricular tachycardia not sure what part of her symptomatology is related to SVT however we will try to give her 25 mg metoprolol succinate see how she responds to it if she has intolerance to this medication she may benefit from calcium channel blocker. Essential hypertension elevated today hopefully will be better with addition of small dose of beta-blocker however I am trying to be very gentle with her since she is quite fragile. Numbness of her fingers.  I have no good explanation for this  I do see lab work test done by primary care physician which includes TSH which is normal. Dyspnea exertion doing well from that point review echocardiogram reviewed.  In spite of her age she is still very active she described the fact that she went to river cruises [place]2, she end up going to venues for about 10 days that they rented a car for couple days and travel around [place] and she enjoyed this tremendously.  She is planning to go to a different state parks this summer.   Medication Adjustments/Labs and Tests Ordered: Current medicines are reviewed at length with the patient today.  Concerns regarding medicines are outlined above.  No orders of the defined types were placed in  this encounter.  Medication changes: No orders of the defined types were placed in this encounter.   Signed, Park Liter, MD, [facility] 10/13/2020 1:32 PM    [place]

## 2020-11-12 DIAGNOSIS — M419 Scoliosis, unspecified: Secondary | ICD-10-CM | POA: Diagnosis not present

## 2020-11-12 DIAGNOSIS — G540 Brachial plexus disorders: Secondary | ICD-10-CM | POA: Diagnosis not present

## 2020-11-12 DIAGNOSIS — R7303 Prediabetes: Secondary | ICD-10-CM | POA: Diagnosis not present

## 2020-11-12 DIAGNOSIS — I10 Essential (primary) hypertension: Secondary | ICD-10-CM | POA: Diagnosis not present

## 2020-11-13 ENCOUNTER — Telehealth: Payer: Self-pay | Admitting: Cardiology

## 2020-11-13 NOTE — Telephone Encounter (Signed)
    Pt c/o medication issue:  1. Name of Medication: metoprolol succinate (TOPROL XL) 25 MG 24 hr tablet  2. How are you currently taking this medication (dosage and times per day)? Take 1 tablet (25 mg total) by mouth daily.  3. Are you having a reaction (difficulty breathing--STAT)?   4. What is your medication issue? Pt is calling to let Dr. Raliegh Ip that she will stop taking this med, she said her HR went down to 40s. She said she might not need it anymore since her BP been really good lately

## 2020-11-17 NOTE — Telephone Encounter (Signed)
Spoke to the patient just now and let her know Dr. Wendy Poet recommendations. She verbalizes understanding.

## 2021-03-10 DIAGNOSIS — H52223 Regular astigmatism, bilateral: Secondary | ICD-10-CM | POA: Diagnosis not present

## 2021-03-10 DIAGNOSIS — H2513 Age-related nuclear cataract, bilateral: Secondary | ICD-10-CM | POA: Diagnosis not present

## 2021-03-10 DIAGNOSIS — H35033 Hypertensive retinopathy, bilateral: Secondary | ICD-10-CM | POA: Diagnosis not present

## 2021-03-10 DIAGNOSIS — H524 Presbyopia: Secondary | ICD-10-CM | POA: Diagnosis not present

## 2021-03-16 DIAGNOSIS — M419 Scoliosis, unspecified: Secondary | ICD-10-CM | POA: Diagnosis not present

## 2021-03-16 DIAGNOSIS — R7303 Prediabetes: Secondary | ICD-10-CM | POA: Diagnosis not present

## 2021-03-16 DIAGNOSIS — G540 Brachial plexus disorders: Secondary | ICD-10-CM | POA: Diagnosis not present

## 2021-03-16 DIAGNOSIS — I10 Essential (primary) hypertension: Secondary | ICD-10-CM | POA: Diagnosis not present

## 2021-05-14 DIAGNOSIS — L570 Actinic keratosis: Secondary | ICD-10-CM | POA: Diagnosis not present

## 2021-05-14 DIAGNOSIS — Z1283 Encounter for screening for malignant neoplasm of skin: Secondary | ICD-10-CM | POA: Diagnosis not present

## 2021-05-14 DIAGNOSIS — X32XXXD Exposure to sunlight, subsequent encounter: Secondary | ICD-10-CM | POA: Diagnosis not present

## 2021-05-14 DIAGNOSIS — D225 Melanocytic nevi of trunk: Secondary | ICD-10-CM | POA: Diagnosis not present

## 2021-07-01 ENCOUNTER — Other Ambulatory Visit: Payer: Self-pay

## 2021-07-01 DIAGNOSIS — M19049 Primary osteoarthritis, unspecified hand: Secondary | ICD-10-CM | POA: Insufficient documentation

## 2021-07-01 DIAGNOSIS — G540 Brachial plexus disorders: Secondary | ICD-10-CM | POA: Insufficient documentation

## 2021-07-02 ENCOUNTER — Other Ambulatory Visit: Payer: Self-pay

## 2021-07-02 ENCOUNTER — Ambulatory Visit: Payer: Medicare Other | Admitting: Cardiology

## 2021-07-02 ENCOUNTER — Encounter: Payer: Self-pay | Admitting: Cardiology

## 2021-07-02 VITALS — BP 130/80 | HR 68 | Ht 62.5 in | Wt 143.0 lb

## 2021-07-02 DIAGNOSIS — I10 Essential (primary) hypertension: Secondary | ICD-10-CM | POA: Diagnosis not present

## 2021-07-02 DIAGNOSIS — R0609 Other forms of dyspnea: Secondary | ICD-10-CM

## 2021-07-02 DIAGNOSIS — R7303 Prediabetes: Secondary | ICD-10-CM | POA: Diagnosis not present

## 2021-07-02 DIAGNOSIS — I471 Supraventricular tachycardia, unspecified: Secondary | ICD-10-CM

## 2021-07-02 NOTE — Progress Notes (Signed)
?Cardiology Office Note:   ? ?Date:  07/02/2021  ? ?ID:  Terry Nunez, DOB 01-05-1932, MRN [id number] ? ?PCP:  Vernie Shanks, MD  ?Cardiologist:  Jenne Campus, MD   ? ?Referring MD: Vernie Shanks, MD  ? ?Chief Complaint  ?Patient presents with  ? Shortness of Breath  ?Doing well ? ?History of Present Illness:   ? ?Terry Nunez is a 88 y.o. female who was referred to me initially because of episode of sweatiness.  So far extensive evaluation has been performed with revealing cause of her symptomatology.  She is 88 years old but after admit she is doing very well.  She still walks on the regular basis.  She still do a lot of sewing.  She recently come back from the trip to [place] where she was visiting her family who stays there for a moderate amount.  Denies having any chest pain tightness squeezing pressure burning chest no palpitations, she did not take any beta-blocker because she felt it was not needed.  Overall mid she is doing very well ? ?Past Medical History:  ?Diagnosis Date  ? Age-related osteoporosis without current pathological fracture 10/12/2020  ? Angina pectoris ([place]) 10/12/2020  ? Arthritis   ? Arthritis of right knee 10/12/2020  ? Basal cell carcinoma of right lower leg   ? Cramp in lower leg associated with rest 10/12/2020  ? Dizziness 12/27/2019  ? Dyspnea on exertion 12/27/2019  ? Essential hypertension 12/27/2019  ? Gait abnormality 05/18/2017  ? Ganglion of hand 10/12/2020  ? Hard of hearing   ? History of bronchitis   ? History of pneumonia   ? Lumbar radiculopathy 10/12/2020  ? Neuroma 06/29/2017  ? Numbness   ? Pain in right foot 10/12/2020  ? Paresthesia 05/18/2017  ? Paresthesia of both hands 10/12/2020  ? Prediabetes 10/12/2020  ? S/p reverse total shoulder arthroplasty 01/14/2016  ? Scoliosis 10/12/2020  ? Shoulder joint pain 10/12/2020  ? Skin cancer 10/12/2020  ? Supraventricular tachycardia ([place]) 03/17/2020  ? Urinary incontinence   ? "very little"  ? ? ?Past Surgical  History:  ?Procedure Laterality Date  ? BASAL CELL CARCINOMA EXCISION    ? right leg  ? REVERSE SHOULDER ARTHROPLASTY Right 01/14/2016  ? Procedure: REVERSE SHOULDER ARTHROPLASTY;  Surgeon: Tania Ade, MD;  Location: [place];  Service: Orthopedics;  Laterality: Right;  Right reverse shoulder arthroplasty  ? TUBAL LIGATION    ? ? ?Current Medications: ?Current Meds  ?Medication Sig  ? hydrochlorothiazide (HYDRODIURIL) 12.5 MG tablet Take 12.5 mg by mouth daily.  ? irbesartan (AVAPRO) 150 MG tablet Take 150 mg by mouth daily.  ? Multiple Vitamin (MULTIVITAMIN WITH MINERALS) TABS tablet Take 1 tablet by mouth daily.  ?  ? ?Allergies:   Codeine  ? ?Social History  ? ?Socioeconomic History  ? Marital status: Widowed  ?  Spouse name: Not on file  ? Number of children: 4  ? Years of education: college  ? Highest education level: Not on file  ?Occupational History  ? Occupation: Retired  ?Tobacco Use  ? Smoking status: Never  ? Smokeless tobacco: Never  ?Vaping Use  ? Vaping Use: Never used  ?Substance and Sexual Activity  ? Alcohol use: Yes  ?  Comment: rarely - socially  ? Drug use: No  ? Sexual activity: Not on file  ?Other Topics Concern  ? Not on file  ?Social History Narrative  ? Lives alone.  ? 2 cups caffeine daily.  ? Right-handed.  ? ?  Social Determinants of Health  ? ?Financial Resource Strain: Not on file  ?Food Insecurity: Not on file  ?Transportation Needs: Not on file  ?Physical Activity: Not on file  ?Stress: Not on file  ?Social Connections: Not on file  ?  ? ?Family History: ?The patient's family history includes Stroke in her father and mother. ?ROS:   ?Please see the history of present illness.    ?All 14 point review of systems negative except as described per history of present illness ? ?EKGs/Labs/Other Studies Reviewed:   ? ? ? ?Recent Labs: ?No results found for requested labs within last 8760 hours.  ?Recent Lipid Panel ?No results found for: CHOL, TRIG, HDL, CHOLHDL, VLDL, LDLCALC,  LDLDIRECT ? ?Physical Exam:   ? ?VS:  BP 130/80 (BP Location: Left Arm, Patient Position: Sitting)   Pulse 68   Ht 5' 2.5" (1.588 m)   Wt 143 lb (64.9 kg)   SpO2 92%   BMI 25.74 kg/m?    ? ?Wt Readings from Last 3 Encounters:  ?07/02/21 143 lb (64.9 kg)  ?10/13/20 141 lb (64 kg)  ?03/17/20 137 lb (62.1 kg)  ?  ? ?GEN:  Well nourished, well developed in no acute distress ?HEENT: Normal ?NECK: No JVD; No carotid bruits ?LYMPHATICS: No lymphadenopathy ?CARDIAC: RRR, no murmurs, no rubs, no gallops ?RESPIRATORY:  Clear to auscultation without rales, wheezing or rhonchi  ?ABDOMEN: Soft, non-tender, non-distended ?MUSCULOSKELETAL:  No edema; No deformity  ?SKIN: Warm and dry ?LOWER EXTREMITIES: no swelling ?NEUROLOGIC:  Alert and oriented x 3 ?PSYCHIATRIC:  Normal affect  ? ?ASSESSMENT:   ? ?1. Supraventricular tachycardia ([place])   ?2. Essential hypertension   ?3. Prediabetes   ?4. Dyspnea on exertion   ? ?PLAN:   ? ?In order of problems listed above: ? ?Supraventricular tachycardia asymptomatic, does not take any medications for it happy the way she is.  We will continue monitoring. ?Unexplained sweatiness did not happen since have seen her last time.  We will continue present management.Marland Kitchen ?Essential hypertension: Blood pressure seems to well controlled we will continue present management. ?Dyspnea on exertion actually she did not tell me that that better is here.  Still very active when asked how she compare herself this year compared to last year she said nothing changed, she feels the same which I think to her age is success ? ? ?Medication Adjustments/Labs and Tests Ordered: ?Current medicines are reviewed at length with the patient today.  Concerns regarding medicines are outlined above.  ?No orders of the defined types were placed in this encounter. ? ?Medication changes: No orders of the defined types were placed in this encounter. ? ? ?Signed, ?Park Liter, MD, [facility] ?07/02/2021 9:36 AM    ?[place] ?

## 2021-07-02 NOTE — Patient Instructions (Signed)

## 2021-08-11 DIAGNOSIS — H8110 Benign paroxysmal vertigo, unspecified ear: Secondary | ICD-10-CM | POA: Diagnosis not present

## 2021-08-11 DIAGNOSIS — H6980 Other specified disorders of Eustachian tube, unspecified ear: Secondary | ICD-10-CM | POA: Diagnosis not present

## 2021-08-11 DIAGNOSIS — H9190 Unspecified hearing loss, unspecified ear: Secondary | ICD-10-CM | POA: Diagnosis not present

## 2021-08-12 DIAGNOSIS — H9121 Sudden idiopathic hearing loss, right ear: Secondary | ICD-10-CM | POA: Diagnosis not present

## 2021-08-12 DIAGNOSIS — R42 Dizziness and giddiness: Secondary | ICD-10-CM | POA: Diagnosis not present

## 2021-08-12 DIAGNOSIS — H8301 Labyrinthitis, right ear: Secondary | ICD-10-CM | POA: Diagnosis not present

## 2021-08-19 DIAGNOSIS — R42 Dizziness and giddiness: Secondary | ICD-10-CM | POA: Diagnosis not present

## 2021-08-19 DIAGNOSIS — H8301 Labyrinthitis, right ear: Secondary | ICD-10-CM | POA: Diagnosis not present

## 2021-08-19 DIAGNOSIS — H9121 Sudden idiopathic hearing loss, right ear: Secondary | ICD-10-CM | POA: Diagnosis not present

## 2021-08-20 ENCOUNTER — Other Ambulatory Visit: Payer: Self-pay

## 2021-08-20 DIAGNOSIS — G540 Brachial plexus disorders: Secondary | ICD-10-CM

## 2021-08-24 ENCOUNTER — Other Ambulatory Visit (HOSPITAL_COMMUNITY): Payer: Self-pay | Admitting: Family Medicine

## 2021-08-24 DIAGNOSIS — I7300 Raynaud's syndrome without gangrene: Secondary | ICD-10-CM

## 2021-08-24 DIAGNOSIS — G540 Brachial plexus disorders: Secondary | ICD-10-CM

## 2021-08-25 ENCOUNTER — Ambulatory Visit (HOSPITAL_COMMUNITY)
Admission: RE | Admit: 2021-08-25 | Discharge: 2021-08-25 | Disposition: A | Discharge status: Home / Self Care | Payer: Medicare Other | Source: Ambulatory Visit | Attending: Vascular Surgery | Admitting: Vascular Surgery

## 2021-08-25 DIAGNOSIS — I7300 Raynaud's syndrome without gangrene: Secondary | ICD-10-CM | POA: Diagnosis not present

## 2021-08-30 NOTE — Progress Notes (Signed)
[facility] [place] ? ?ASSESSMENT / PLAN: ?88 y.o. female with bilateral hand paresthesias and numbness during certain positions (laying on her side in bed) and activities (driving for a long period of time).  She has no scalene tenderness.  She does not have symptoms reproducible by elevated arm stress test. She has no history of upper extremity trauma. I have low clinical concern for thoracic outlet syndrome. Cervical spinal stenosis or radiculopathy could be considered as a cause of her symptoms. Follow up with me as needed. ? ?CHIEF COMPLAINT: arm paresthesias ? ?HISTORY OF PRESENT ILLNESS: ?Terry Nunez is a 88 y.o. female who presents to clinic for evaluation of bilateral hand paresthesias.  She notes these are most notable when laying on her side in bed.  She also notes this when driving for long period of time to visit relatives in [place].  She has no history of upper extremity or shoulder trauma. ? ?Past Medical History:  ?Diagnosis Date  ? Age-related osteoporosis without current pathological fracture 10/12/2020  ? Angina pectoris ([place]) 10/12/2020  ? Arthritis   ? Arthritis of right knee 10/12/2020  ? Basal cell carcinoma of right lower leg   ? Cramp in lower leg associated with rest 10/12/2020  ? Dizziness 12/27/2019  ? Dyspnea on exertion 12/27/2019  ? Essential hypertension 12/27/2019  ? Gait abnormality 05/18/2017  ? Ganglion of hand 10/12/2020  ? Hard of hearing   ? History of bronchitis   ? History of pneumonia   ? Lumbar radiculopathy 10/12/2020  ? Neuroma 06/29/2017  ? Numbness   ? Pain in right foot 10/12/2020  ? Paresthesia 05/18/2017  ? Paresthesia of both hands 10/12/2020  ? Prediabetes 10/12/2020  ? S/p reverse total shoulder arthroplasty 01/14/2016  ? Scoliosis 10/12/2020  ? Shoulder joint pain 10/12/2020  ? Skin cancer 10/12/2020  ? Supraventricular tachycardia ([place]) 03/17/2020  ? Urinary incontinence   ? "very little"  ? ? ?Past Surgical History:   ?Procedure Laterality Date  ? BASAL CELL CARCINOMA EXCISION    ? right leg  ? REVERSE SHOULDER ARTHROPLASTY Right 01/14/2016  ? Procedure: REVERSE SHOULDER ARTHROPLASTY;  Surgeon: Tania Ade, MD;  Location: [place];  Service: Orthopedics;  Laterality: Right;  Right reverse shoulder arthroplasty  ? TUBAL LIGATION    ? ? ?Family History  ?Problem Relation Age of Onset  ? Stroke Mother   ? Stroke Father   ? ? ?Social History  ? ?Socioeconomic History  ? Marital status: Widowed  ?  Spouse name: Not on file  ? Number of children: 4  ? Years of education: college  ? Highest education level: Not on file  ?Occupational History  ? Occupation: Retired  ?Tobacco Use  ? Smoking status: Never  ? Smokeless tobacco: Never  ?Vaping Use  ? Vaping Use: Never used  ?Substance and Sexual Activity  ? Alcohol use: Yes  ?  Comment: rarely - socially  ? Drug use: No  ? Sexual activity: Not on file  ?Other Topics Concern  ? Not on file  ?Social History Narrative  ? Lives alone.  ? 2 cups caffeine daily.  ? Right-handed.  ? ?Social Determinants of Health  ? ?Financial Resource Strain: Not on file  ?Food Insecurity: Not on file  ?Transportation Needs: Not on file  ?Physical Activity: Not on file  ?Stress: Not on file  ?Social Connections: Not on file  ?Intimate Partner Violence: Not on file  ? ? ?Allergies  ?Allergen Reactions  ?  Codeine Rash  ? ? ?Current Outpatient Medications  ?Medication Sig Dispense Refill  ? hydrochlorothiazide (HYDRODIURIL) 12.5 MG tablet Take 12.5 mg by mouth daily.    ? irbesartan (AVAPRO) 150 MG tablet Take 150 mg by mouth daily.    ? Multiple Vitamin (MULTIVITAMIN WITH MINERALS) TABS tablet Take 1 tablet by mouth daily.    ? ?No current facility-administered medications for this visit.  ? ? ?PHYSICAL EXAM ?Vitals:  ? 08/31/21 1051  ?BP: 126/82  ?Pulse: 75  ?Resp: 20  ?Temp: 97.7 ?F (36.5 ?C)  ?SpO2: 95%  ?Weight: 145 lb (65.8 kg)  ?Height: 5' 2.5" (1.588 m)  ? ? ?Constitutional: Elderly woman appearing her  stated age.  In no acute distress. ?Neurologic: Normal motor and sensory function in the upper extremities bilaterally.  Unable to reproduce symptoms with an elevated arm stress test.  No scalene muscle tenderness.  Palpating the scalene does not reproduce symptoms in the hands. ?Cardiac: regular rate and rhythm.  ?Respiratory:  unlabored. ?Abdominal:  soft, non-tender, non-distended.  ?Peripheral vascular: 2+ radial pulses bilaterally. ? ?PERTINENT LABORATORY AND RADIOLOGIC DATA ? ?Most recent CBC ? ?  Latest Ref Rng & Units 01/15/2016  ?  5:51 AM 01/05/2016  ? 10:29 AM  ?CBC  ?WBC 4.0 - 10.5 K/uL 10.5   7.0    ?Hemoglobin 12.0 - 15.0 g/dL 12.8   15.5    ?Hematocrit 36.0 - 46.0 % 40.1   48.0    ?Platelets 150 - 400 K/uL 148   171    ?  ? ?Most recent CMP ? ?  Latest Ref Rng & Units 01/15/2016  ?  5:51 AM 01/05/2016  ? 10:29 AM  ?CMP  ?Glucose 65 - 99 mg/dL 122   90    ?BUN 6 - 20 mg/dL 11   18    ?Creatinine 0.44 - 1.00 mg/dL 0.66   0.63    ?Sodium 135 - 145 mmol/L 139   140    ?Potassium 3.5 - 5.1 mmol/L 4.2   4.4    ?Chloride 101 - 111 mmol/L 107   107    ?CO2 22 - 32 mmol/L 25   25    ?Calcium 8.9 - 10.3 mg/dL 8.7   9.5    ?Total Protein 6.5 - 8.1 g/dL  6.8    ?Total Bilirubin 0.3 - 1.2 mg/dL  0.9    ?Alkaline Phos 38 - 126 U/L  68    ?AST 15 - 41 U/L  28    ?ALT 14 - 54 U/L  21    ? ? ?Renal function ?CrCl cannot be calculated (Patient's most recent lab result is older than the maximum 21 days allowed.). ? ?Non-invasive testing shows no change in finger PPG with provocative maneuvers. ? ?Yevonne Aline. Stanford Breed, MD ?[facility] [place] ?Office Phone Number: [phone] ?08/31/2021 2:21 PM ? ?Total time spent on preparing this encounter including chart review, data review, collecting history, examining the patient, coordinating care for this new patient, 60 minutes. ? ?Portions of this report may have been transcribed using voice recognition software.  Every effort has been made to ensure accuracy;  however, inadvertent computerized transcription errors may still be present. ? ? ? ?

## 2021-08-31 ENCOUNTER — Ambulatory Visit: Payer: Medicare Other | Admitting: Vascular Surgery

## 2021-08-31 ENCOUNTER — Encounter: Payer: Self-pay | Admitting: Vascular Surgery

## 2021-08-31 VITALS — BP 126/82 | HR 75 | Temp 97.7°F | Resp 20 | Ht 62.5 in | Wt 145.0 lb

## 2021-08-31 DIAGNOSIS — R202 Paresthesia of skin: Secondary | ICD-10-CM

## 2021-09-06 DIAGNOSIS — M19041 Primary osteoarthritis, right hand: Secondary | ICD-10-CM | POA: Diagnosis not present

## 2021-09-06 DIAGNOSIS — M419 Scoliosis, unspecified: Secondary | ICD-10-CM | POA: Diagnosis not present

## 2021-09-06 DIAGNOSIS — R7303 Prediabetes: Secondary | ICD-10-CM | POA: Diagnosis not present

## 2021-09-06 DIAGNOSIS — R202 Paresthesia of skin: Secondary | ICD-10-CM | POA: Diagnosis not present

## 2021-09-06 DIAGNOSIS — I10 Essential (primary) hypertension: Secondary | ICD-10-CM | POA: Diagnosis not present

## 2021-09-08 DIAGNOSIS — H903 Sensorineural hearing loss, bilateral: Secondary | ICD-10-CM | POA: Diagnosis not present

## 2021-09-08 DIAGNOSIS — R42 Dizziness and giddiness: Secondary | ICD-10-CM | POA: Diagnosis not present

## 2021-09-17 ENCOUNTER — Other Ambulatory Visit: Payer: Self-pay | Admitting: Otolaryngology

## 2021-09-17 DIAGNOSIS — H918X9 Other specified hearing loss, unspecified ear: Secondary | ICD-10-CM

## 2021-10-15 ENCOUNTER — Ambulatory Visit
Admission: RE | Admit: 2021-10-15 | Discharge: 2021-10-15 | Disposition: A | Discharge status: Home / Self Care | Payer: Medicare Other | Source: Ambulatory Visit | Attending: Otolaryngology | Admitting: Otolaryngology

## 2021-10-15 DIAGNOSIS — G319 Degenerative disease of nervous system, unspecified: Secondary | ICD-10-CM | POA: Diagnosis not present

## 2021-10-15 DIAGNOSIS — H918X9 Other specified hearing loss, unspecified ear: Secondary | ICD-10-CM

## 2021-10-15 DIAGNOSIS — I6381 Other cerebral infarction due to occlusion or stenosis of small artery: Secondary | ICD-10-CM | POA: Diagnosis not present

## 2021-10-15 DIAGNOSIS — H9190 Unspecified hearing loss, unspecified ear: Secondary | ICD-10-CM | POA: Diagnosis not present

## 2021-10-15 DIAGNOSIS — J329 Chronic sinusitis, unspecified: Secondary | ICD-10-CM | POA: Diagnosis not present

## 2021-10-15 MED ORDER — GADOBENATE DIMEGLUMINE 529 MG/ML IV SOLN
13.0000 mL | Freq: Once | INTRAVENOUS | Status: AC | PRN
Start: 2021-10-15 — End: 2021-10-15
  Administered 2021-10-15: 13 mL via INTRAVENOUS

## 2021-10-30 DIAGNOSIS — R059 Cough, unspecified: Secondary | ICD-10-CM | POA: Diagnosis not present

## 2021-10-30 DIAGNOSIS — J209 Acute bronchitis, unspecified: Secondary | ICD-10-CM | POA: Diagnosis not present

## 2021-10-30 DIAGNOSIS — Z20828 Contact with and (suspected) exposure to other viral communicable diseases: Secondary | ICD-10-CM | POA: Diagnosis not present

## 2021-11-20 DIAGNOSIS — R059 Cough, unspecified: Secondary | ICD-10-CM | POA: Diagnosis not present

## 2021-11-20 DIAGNOSIS — R52 Pain, unspecified: Secondary | ICD-10-CM | POA: Diagnosis not present

## 2021-11-20 DIAGNOSIS — R5383 Other fatigue: Secondary | ICD-10-CM | POA: Diagnosis not present

## 2021-11-20 DIAGNOSIS — J40 Bronchitis, not specified as acute or chronic: Secondary | ICD-10-CM | POA: Diagnosis not present

## 2021-11-22 DIAGNOSIS — J189 Pneumonia, unspecified organism: Secondary | ICD-10-CM | POA: Diagnosis not present

## 2021-11-24 DIAGNOSIS — J209 Acute bronchitis, unspecified: Secondary | ICD-10-CM | POA: Diagnosis not present

## 2021-12-07 DIAGNOSIS — R0982 Postnasal drip: Secondary | ICD-10-CM | POA: Diagnosis not present

## 2021-12-07 DIAGNOSIS — R6883 Chills (without fever): Secondary | ICD-10-CM | POA: Diagnosis not present

## 2021-12-07 DIAGNOSIS — B349 Viral infection, unspecified: Secondary | ICD-10-CM | POA: Diagnosis not present

## 2021-12-07 DIAGNOSIS — U071 COVID-19: Secondary | ICD-10-CM | POA: Diagnosis not present

## 2021-12-07 DIAGNOSIS — R059 Cough, unspecified: Secondary | ICD-10-CM | POA: Diagnosis not present

## 2021-12-30 ENCOUNTER — Ambulatory Visit: Payer: Medicare Other | Admitting: Cardiology

## 2022-01-19 DIAGNOSIS — Z1283 Encounter for screening for malignant neoplasm of skin: Secondary | ICD-10-CM | POA: Diagnosis not present

## 2022-01-19 DIAGNOSIS — D225 Melanocytic nevi of trunk: Secondary | ICD-10-CM | POA: Diagnosis not present

## 2022-01-19 DIAGNOSIS — X32XXXD Exposure to sunlight, subsequent encounter: Secondary | ICD-10-CM | POA: Diagnosis not present

## 2022-01-19 DIAGNOSIS — L570 Actinic keratosis: Secondary | ICD-10-CM | POA: Diagnosis not present

## 2022-02-11 DIAGNOSIS — L570 Actinic keratosis: Secondary | ICD-10-CM | POA: Diagnosis not present

## 2022-02-11 DIAGNOSIS — X32XXXD Exposure to sunlight, subsequent encounter: Secondary | ICD-10-CM | POA: Diagnosis not present

## 2022-03-07 DIAGNOSIS — I10 Essential (primary) hypertension: Secondary | ICD-10-CM | POA: Diagnosis not present

## 2022-03-07 DIAGNOSIS — R202 Paresthesia of skin: Secondary | ICD-10-CM | POA: Diagnosis not present

## 2022-03-07 DIAGNOSIS — R7303 Prediabetes: Secondary | ICD-10-CM | POA: Diagnosis not present

## 2022-03-07 DIAGNOSIS — Z6824 Body mass index (BMI) 24.0-24.9, adult: Secondary | ICD-10-CM | POA: Diagnosis not present

## 2022-03-09 DIAGNOSIS — H903 Sensorineural hearing loss, bilateral: Secondary | ICD-10-CM | POA: Diagnosis not present

## 2022-03-09 DIAGNOSIS — R42 Dizziness and giddiness: Secondary | ICD-10-CM | POA: Diagnosis not present

## 2022-03-21 DIAGNOSIS — J329 Chronic sinusitis, unspecified: Secondary | ICD-10-CM | POA: Diagnosis not present

## 2022-03-21 DIAGNOSIS — J40 Bronchitis, not specified as acute or chronic: Secondary | ICD-10-CM | POA: Diagnosis not present

## 2022-03-21 DIAGNOSIS — R059 Cough, unspecified: Secondary | ICD-10-CM | POA: Diagnosis not present

## 2022-03-21 DIAGNOSIS — H01004 Unspecified blepharitis left upper eyelid: Secondary | ICD-10-CM | POA: Diagnosis not present

## 2022-03-22 DIAGNOSIS — Z961 Presence of intraocular lens: Secondary | ICD-10-CM | POA: Diagnosis not present

## 2022-03-22 DIAGNOSIS — H26493 Other secondary cataract, bilateral: Secondary | ICD-10-CM | POA: Diagnosis not present

## 2022-03-22 DIAGNOSIS — I10 Essential (primary) hypertension: Secondary | ICD-10-CM | POA: Diagnosis not present

## 2022-03-22 DIAGNOSIS — H35033 Hypertensive retinopathy, bilateral: Secondary | ICD-10-CM | POA: Diagnosis not present

## 2022-04-19 DIAGNOSIS — J209 Acute bronchitis, unspecified: Secondary | ICD-10-CM | POA: Diagnosis not present

## 2022-04-19 DIAGNOSIS — J069 Acute upper respiratory infection, unspecified: Secondary | ICD-10-CM | POA: Diagnosis not present

## 2022-04-27 ENCOUNTER — Ambulatory Visit: Payer: BLUE CROSS/BLUE SHIELD | Attending: Cardiology | Admitting: Cardiology

## 2022-04-27 ENCOUNTER — Encounter: Payer: Self-pay | Admitting: Cardiology

## 2022-04-27 VITALS — BP 122/70 | HR 77 | Ht 62.5 in | Wt 136.0 lb

## 2022-04-27 DIAGNOSIS — R0609 Other forms of dyspnea: Secondary | ICD-10-CM

## 2022-04-27 DIAGNOSIS — I471 Supraventricular tachycardia, unspecified: Secondary | ICD-10-CM

## 2022-04-27 DIAGNOSIS — I10 Essential (primary) hypertension: Secondary | ICD-10-CM | POA: Diagnosis not present

## 2022-04-27 DIAGNOSIS — R7303 Prediabetes: Secondary | ICD-10-CM

## 2022-04-27 NOTE — Progress Notes (Signed)
Cardiology Office Note:    Date:  04/27/2022   ID:  Terry Nunez, DOB 21-May-1931, MRN [id number]  PCP:  Vernie Shanks, MD (Inactive)  Cardiologist:  Jenne Campus, MD    Referring MD: Vernie Shanks, MD   Chief Complaint  Patient presents with   Follow-up  I am doing fine  History of Present Illness:    Terry Nunez is a 89 y.o. female with past medical history significant for supraventricular tachycardia, she was referred to [place] because of episodes of profound sweating on top of that she does have episode of palpitations.  She is coming today to months for follow-up overall doing great.  Still traveling a lot this year she was in [place] when she was in the river cruise and then she was in a cruise in [place] she enjoyed it tremendously can walk climb stairs with no difficulties.  Describes 1 episode of profound weakness that really brought her to initially and we still have no explanation for this sensation.  She said maybe will be more shortness of breath than usual.  Otherwise no palpitations  Past Medical History:  Diagnosis Date   Age-related osteoporosis without current pathological fracture 10/12/2020   Angina pectoris ([place]) 10/12/2020   Arthritis    Arthritis of right knee 10/12/2020   Basal cell carcinoma of right lower leg    Cramp in lower leg associated with rest 10/12/2020   Dizziness 12/27/2019   Dyspnea on exertion 12/27/2019   Essential hypertension 12/27/2019   Gait abnormality 05/18/2017   Ganglion of hand 10/12/2020   Hard of hearing    History of bronchitis    History of pneumonia    Lumbar radiculopathy 10/12/2020   Neuroma 06/29/2017   Numbness    Pain in right foot 10/12/2020   Paresthesia 05/18/2017   Paresthesia of both hands 10/12/2020   Prediabetes 10/12/2020   S/p reverse total shoulder arthroplasty 01/14/2016   Scoliosis 10/12/2020   Shoulder joint pain 10/12/2020   Skin cancer 10/12/2020   Supraventricular tachycardia 03/17/2020    Urinary incontinence    "very little"    Past Surgical History:  Procedure Laterality Date   BASAL CELL CARCINOMA EXCISION     right leg   REVERSE SHOULDER ARTHROPLASTY Right 01/14/2016   Procedure: REVERSE SHOULDER ARTHROPLASTY;  Surgeon: Tania Ade, MD;  Location: [facility];  Service: Orthopedics;  Laterality: Right;  Right reverse shoulder arthroplasty   TUBAL LIGATION      Current Medications: Current Meds  Medication Sig   hydrochlorothiazide (HYDRODIURIL) 12.5 MG tablet Take 12.5 mg by mouth daily.   irbesartan (AVAPRO) 150 MG tablet Take 150 mg by mouth daily.   Multiple Vitamin (MULTIVITAMIN WITH MINERALS) TABS tablet Take 1 tablet by mouth daily.     Allergies:   Codeine   Social History   Socioeconomic History   Marital status: Widowed    Spouse name: Not on file   Number of children: 4   Years of education: college   Highest education level: Not on file  Occupational History   Occupation: Retired  Tobacco Use   Smoking status: Never   Smokeless tobacco: Never  Vaping Use   Vaping Use: Never used  Substance and Sexual Activity   Alcohol use: Yes    Comment: rarely - socially   Drug use: No   Sexual activity: Not on file  Other Topics Concern   Not on file  Social History Narrative   Lives alone.   2 cups  caffeine daily.   Right-handed.   Social Determinants of Health   Financial Resource Strain: Not on file  Food Insecurity: Not on file  Transportation Needs: Not on file  Physical Activity: Not on file  Stress: Not on file  Social Connections: Not on file     Family History: The patient's family history includes Stroke in her father and mother. ROS:   Please see the history of present illness.    All 14 point review of systems negative except as described per history of present illness  EKGs/Labs/Other Studies Reviewed:      Recent Labs: No results found for requested labs within last 365 days.  Recent Lipid Panel No results found  for: "CHOL", "TRIG", "HDL", "CHOLHDL", "VLDL", "LDLCALC", "LDLDIRECT"  Physical Exam:    VS:  BP 122/70 (BP Location: Left Arm, Patient Position: Sitting)   Pulse 77   Ht 5' 2.5" (1.588 m)   Wt 136 lb (61.7 kg)   SpO2 98%   BMI 24.48 kg/m     Wt Readings from Last 3 Encounters:  04/27/22 136 lb (61.7 kg)  08/31/21 145 lb (65.8 kg)  07/02/21 143 lb (64.9 kg)     GEN:  Well nourished, well developed in no acute distress HEENT: Normal NECK: No JVD; No carotid bruits LYMPHATICS: No lymphadenopathy CARDIAC: RRR, no murmurs, no rubs, no gallops RESPIRATORY:  Clear to auscultation without rales, wheezing or rhonchi  ABDOMEN: Soft, non-tender, non-distended MUSCULOSKELETAL:  No edema; No deformity  SKIN: Warm and dry LOWER EXTREMITIES: no swelling NEUROLOGIC:  Alert and oriented x 3 PSYCHIATRIC:  Normal affect   ASSESSMENT:    1. Essential hypertension   2. Supraventricular tachycardia   3. Dyspnea on exertion   4. Prediabetes    PLAN:    In order of problems listed above:  Essential hypertension blood pressure well-controlled continue present management. Supraventricular tachycardia denies have any palpitations episode of weakness that she has a still a mystery.  She tells me that she does have a Fitbit to monitor number of steps that she does have suggested to get Apple Watch that we will is to record her EKG.  She will try to get it. Dyspnea on exertion I will ask her to have echocardiogram to assess left ventricle ejection fraction. Prediabetes that being followed by internal medicine team I did review K PN which only her hemoglobin A1c of 5.8 this is from March 07, 2022   Medication Adjustments/Labs and Tests Ordered: Current medicines are reviewed at length with the patient today.  Concerns regarding medicines are outlined above.  No orders of the defined types were placed in this encounter.  Medication changes: No orders of the defined types were placed in this  encounter.   Signed, Park Liter, MD, [facility] 04/27/2022 10:50 AM    [place]

## 2022-04-27 NOTE — Patient Instructions (Addendum)

## 2022-05-18 ENCOUNTER — Ambulatory Visit (HOSPITAL_BASED_OUTPATIENT_CLINIC_OR_DEPARTMENT_OTHER)
Admission: RE | Admit: 2022-05-18 | Discharge: 2022-05-18 | Disposition: A | Discharge status: Home / Self Care | Payer: Medicare Other | Source: Ambulatory Visit | Attending: Cardiology | Admitting: Cardiology

## 2022-05-18 DIAGNOSIS — R0609 Other forms of dyspnea: Secondary | ICD-10-CM

## 2022-05-18 LAB — ECHOCARDIOGRAM COMPLETE
Area-P 1/2: 4.24 cm2
S' Lateral: 2.9 cm

## 2022-05-20 ENCOUNTER — Telehealth: Payer: Self-pay

## 2022-05-20 NOTE — Telephone Encounter (Signed)
Patient notified of results.

## 2022-05-20 NOTE — Telephone Encounter (Signed)
-----  Message from Park Liter, MD sent at 05/20/2022  8:59 AM EST ----- Echocardiogram showed normal left ventricle ejection fraction, overall looks good

## 2022-09-09 DIAGNOSIS — H9193 Unspecified hearing loss, bilateral: Secondary | ICD-10-CM | POA: Diagnosis not present

## 2022-09-09 DIAGNOSIS — R7303 Prediabetes: Secondary | ICD-10-CM | POA: Diagnosis not present

## 2022-09-09 DIAGNOSIS — I471 Supraventricular tachycardia, unspecified: Secondary | ICD-10-CM | POA: Diagnosis not present

## 2022-09-09 DIAGNOSIS — M810 Age-related osteoporosis without current pathological fracture: Secondary | ICD-10-CM | POA: Diagnosis not present

## 2022-09-09 DIAGNOSIS — I10 Essential (primary) hypertension: Secondary | ICD-10-CM | POA: Diagnosis not present

## 2022-09-14 DIAGNOSIS — Z8639 Personal history of other endocrine, nutritional and metabolic disease: Secondary | ICD-10-CM | POA: Diagnosis not present

## 2022-09-14 DIAGNOSIS — I10 Essential (primary) hypertension: Secondary | ICD-10-CM | POA: Diagnosis not present

## 2022-09-14 DIAGNOSIS — R7303 Prediabetes: Secondary | ICD-10-CM | POA: Diagnosis not present

## 2022-11-29 DIAGNOSIS — J209 Acute bronchitis, unspecified: Secondary | ICD-10-CM | POA: Diagnosis not present

## 2022-12-06 DIAGNOSIS — L570 Actinic keratosis: Secondary | ICD-10-CM | POA: Diagnosis not present

## 2022-12-06 DIAGNOSIS — Z1283 Encounter for screening for malignant neoplasm of skin: Secondary | ICD-10-CM | POA: Diagnosis not present

## 2022-12-06 DIAGNOSIS — X32XXXD Exposure to sunlight, subsequent encounter: Secondary | ICD-10-CM | POA: Diagnosis not present

## 2022-12-06 DIAGNOSIS — D225 Melanocytic nevi of trunk: Secondary | ICD-10-CM | POA: Diagnosis not present

## 2022-12-21 DIAGNOSIS — M79642 Pain in left hand: Secondary | ICD-10-CM | POA: Insufficient documentation

## 2022-12-21 DIAGNOSIS — M79641 Pain in right hand: Secondary | ICD-10-CM | POA: Diagnosis not present

## 2023-01-04 DIAGNOSIS — R202 Paresthesia of skin: Secondary | ICD-10-CM | POA: Diagnosis not present

## 2023-01-04 DIAGNOSIS — I10 Essential (primary) hypertension: Secondary | ICD-10-CM | POA: Diagnosis not present

## 2023-01-04 DIAGNOSIS — M19042 Primary osteoarthritis, left hand: Secondary | ICD-10-CM | POA: Diagnosis not present

## 2023-01-04 DIAGNOSIS — M19041 Primary osteoarthritis, right hand: Secondary | ICD-10-CM | POA: Diagnosis not present

## 2023-02-10 DIAGNOSIS — M545 Low back pain, unspecified: Secondary | ICD-10-CM | POA: Diagnosis not present

## 2023-02-17 DIAGNOSIS — M545 Low back pain, unspecified: Secondary | ICD-10-CM | POA: Diagnosis not present

## 2023-02-24 DIAGNOSIS — M545 Low back pain, unspecified: Secondary | ICD-10-CM | POA: Diagnosis not present

## 2023-03-03 DIAGNOSIS — M545 Low back pain, unspecified: Secondary | ICD-10-CM | POA: Diagnosis not present

## 2023-03-17 DIAGNOSIS — Z0000 Encounter for general adult medical examination without abnormal findings: Secondary | ICD-10-CM | POA: Diagnosis not present

## 2023-03-17 DIAGNOSIS — M545 Low back pain, unspecified: Secondary | ICD-10-CM | POA: Diagnosis not present

## 2023-03-28 DIAGNOSIS — I10 Essential (primary) hypertension: Secondary | ICD-10-CM | POA: Diagnosis not present

## 2023-03-28 DIAGNOSIS — H52223 Regular astigmatism, bilateral: Secondary | ICD-10-CM | POA: Diagnosis not present

## 2023-03-28 DIAGNOSIS — H35033 Hypertensive retinopathy, bilateral: Secondary | ICD-10-CM | POA: Diagnosis not present

## 2023-03-28 DIAGNOSIS — H524 Presbyopia: Secondary | ICD-10-CM | POA: Diagnosis not present

## 2023-03-28 DIAGNOSIS — H26493 Other secondary cataract, bilateral: Secondary | ICD-10-CM | POA: Diagnosis not present

## 2023-03-31 DIAGNOSIS — Z23 Encounter for immunization: Secondary | ICD-10-CM | POA: Diagnosis not present

## 2023-04-20 DIAGNOSIS — J029 Acute pharyngitis, unspecified: Secondary | ICD-10-CM | POA: Diagnosis not present

## 2023-04-20 DIAGNOSIS — R059 Cough, unspecified: Secondary | ICD-10-CM | POA: Diagnosis not present

## 2023-04-20 DIAGNOSIS — R509 Fever, unspecified: Secondary | ICD-10-CM | POA: Diagnosis not present

## 2023-04-20 DIAGNOSIS — Z20822 Contact with and (suspected) exposure to covid-19: Secondary | ICD-10-CM | POA: Diagnosis not present

## 2023-05-03 DIAGNOSIS — Z20822 Contact with and (suspected) exposure to covid-19: Secondary | ICD-10-CM | POA: Diagnosis not present

## 2023-05-03 DIAGNOSIS — R059 Cough, unspecified: Secondary | ICD-10-CM | POA: Diagnosis not present

## 2023-05-03 DIAGNOSIS — R062 Wheezing: Secondary | ICD-10-CM | POA: Diagnosis not present

## 2023-05-12 ENCOUNTER — Ambulatory Visit: Payer: Medicare Other

## 2023-05-12 ENCOUNTER — Encounter: Payer: Self-pay | Admitting: Cardiology

## 2023-05-12 ENCOUNTER — Ambulatory Visit: Payer: Medicare Other | Attending: Cardiology | Admitting: Cardiology

## 2023-05-12 VITALS — BP 110/60 | HR 78 | Ht 62.5 in | Wt 138.0 lb

## 2023-05-12 DIAGNOSIS — R42 Dizziness and giddiness: Secondary | ICD-10-CM

## 2023-05-12 DIAGNOSIS — I10 Essential (primary) hypertension: Secondary | ICD-10-CM

## 2023-05-12 DIAGNOSIS — I471 Supraventricular tachycardia, unspecified: Secondary | ICD-10-CM | POA: Diagnosis not present

## 2023-05-12 DIAGNOSIS — R0609 Other forms of dyspnea: Secondary | ICD-10-CM

## 2023-05-12 NOTE — Progress Notes (Signed)
Cardiology Office Note:    Date:  05/12/2023   ID:  Terry Nunez, DOB June 21, 1931, MRN [id number]  PCP:  Ileana Ladd, MD (Inactive)  Cardiologist:  Gypsy Balsam, MD    Referring MD: No ref. provider found   Chief Complaint  Patient presents with   Follow-up   Dizziness   Excessive Sweating    History of Present Illness:    Terry Nunez is a 89 y.o. female past medical history significant for supraventricular tachycardia she was referred to [place] because of strange episode of profound sweatiness and dizziness that happen all different situations so far we did monitor which did not reveal any significant arrhythmia except for supraventricular tachycardia which was asymptomatic.  However when she was monitored she did not have any episodes.  Comes today to my office still described to have those episodes does happen every month or 2.  Dimitriu been very dizzy very weak very tired and sweaty.  I suspect vasovagal events but I cannot rule out significant arrhythmia.  No chest pain tightness squeezing pressure burning chest.  Still very active travels a lot  Past Medical History:  Diagnosis Date   Age-related osteoporosis without current pathological fracture 10/12/2020   Angina pectoris ([facility]) 10/12/2020   Arthritis    Arthritis of right knee 10/12/2020   Basal cell carcinoma of right lower leg    Cramp in lower leg associated with rest 10/12/2020   Dizziness 12/27/2019   Dyspnea on exertion 12/27/2019   Essential hypertension 12/27/2019   Gait abnormality 05/18/2017   Ganglion of hand 10/12/2020   Hard of hearing    History of bronchitis    History of pneumonia    Lumbar radiculopathy 10/12/2020   Neuroma 06/29/2017   Numbness    Pain in right foot 10/12/2020   Paresthesia 05/18/2017   Paresthesia of both hands 10/12/2020   Prediabetes 10/12/2020   S/p reverse total shoulder arthroplasty 01/14/2016   Scoliosis 10/12/2020   Shoulder joint pain 10/12/2020   Skin cancer  10/12/2020   Supraventricular tachycardia ([facility]) 03/17/2020   Urinary incontinence    "very little"    Past Surgical History:  Procedure Laterality Date   BASAL CELL CARCINOMA EXCISION     right leg   REVERSE SHOULDER ARTHROPLASTY Right 01/14/2016   Procedure: REVERSE SHOULDER ARTHROPLASTY;  Surgeon: Jones Broom, MD;  Location: [facility];  Service: Orthopedics;  Laterality: Right;  Right reverse shoulder arthroplasty   TUBAL LIGATION      Current Medications: Current Meds  Medication Sig   hydrochlorothiazide (HYDRODIURIL) 12.5 MG tablet Take 12.5 mg by mouth daily.   irbesartan (AVAPRO) 150 MG tablet Take 150 mg by mouth daily.   Multiple Vitamin (MULTIVITAMIN WITH MINERALS) TABS tablet Take 1 tablet by mouth daily.   [DISCONTINUED] irbesartan-hydrochlorothiazide (AVALIDE) 150-12.5 MG tablet Take 1 tablet by mouth daily.     Allergies:   Codeine   Social History   Socioeconomic History   Marital status: Widowed    Spouse name: Not on file   Number of children: 4   Years of education: college   Highest education level: Not on file  Occupational History   Occupation: Retired  Tobacco Use   Smoking status: Never   Smokeless tobacco: Never  Vaping Use   Vaping status: Never Used  Substance and Sexual Activity   Alcohol use: Yes    Comment: rarely - socially   Drug use: No   Sexual activity: Not on file  Other Topics Concern  Not on file  Social History Narrative   Lives alone.   2 cups caffeine daily.   Right-handed.   Social Drivers of Corporate investment banker Strain: Not on file  Food Insecurity: Not on file  Transportation Needs: Not on file  Physical Activity: Not on file  Stress: Not on file  Social Connections: Not on file     Family History: The patient's family history includes Stroke in her father and mother. ROS:   Please see the history of present illness.    All 14 point review of systems negative except as described per history of present  illness  EKGs/Labs/Other Studies Reviewed:    EKG Interpretation Date/Time:  Friday May 12 2023 08:50:55 EST Ventricular Rate:  78 PR Interval:  106 QRS Duration:  88 QT Interval:  364 QTC Calculation: 414 R Axis:   -7  Text Interpretation: Sinus rhythm with short PR Septal infarct (cited on or before 05-Jan-2016) When compared with ECG of 05-Jan-2016 10:43, Questionable change in initial forces of Septal leads Confirmed by Gypsy Balsam [phone]) on 05/12/2023 9:29:38 AM    Recent Labs: No results found for requested labs within last 365 days.  Recent Lipid Panel No results found for: "CHOL", "TRIG", "HDL", "CHOLHDL", "VLDL", "LDLCALC", "LDLDIRECT"  Physical Exam:    VS:  BP 110/60 (BP Location: Right Arm, Patient Position: Sitting)   Pulse 78   Ht 5' 2.5" (1.588 m)   Wt 138 lb (62.6 kg)   SpO2 94%   BMI 24.84 kg/m     Wt Readings from Last 3 Encounters:  05/12/23 138 lb (62.6 kg)  04/27/22 136 lb (61.7 kg)  08/31/21 145 lb (65.8 kg)     GEN:  Well nourished, well developed in no acute distress HEENT: Normal NECK: No JVD; No carotid bruits LYMPHATICS: No lymphadenopathy CARDIAC: RRR, no murmurs, no rubs, no gallops RESPIRATORY:  Clear to auscultation without rales, wheezing or rhonchi  ABDOMEN: Soft, non-tender, non-distended MUSCULOSKELETAL:  No edema; No deformity  SKIN: Warm and dry LOWER EXTREMITIES: no swelling NEUROLOGIC:  Alert and oriented x 3 PSYCHIATRIC:  Normal affect   ASSESSMENT:    1. Essential hypertension   2. Supraventricular tachycardia (HCC)   3. Dizziness   4. Dyspnea on exertion    PLAN:    In order of problems listed above:  Episode of dizziness and I will put another Zio patch on her.  I will discontinue her hydrochlorothiazide since her blood pressure is low and she tells me anytime she checked it is on the lower side I am worried she may be suffering from dehydration be contributing to symptomatology. Supraventricular  tachycardia no need to treat asymptomatic we will put monitor on. Dyspnea on exertion last echocardiogram was good and she is feeling well   Medication Adjustments/Labs and Tests Ordered: Current medicines are reviewed at length with the patient today.  Concerns regarding medicines are outlined above.  Orders Placed This Encounter  Procedures   EKG 12-Lead   Medication changes: No orders of the defined types were placed in this encounter.   Signed, Georgeanna Lea, MD, [facility] 05/12/2023 9:39 AM    [facility]

## 2023-05-12 NOTE — Patient Instructions (Signed)
Medication Instructions:    Stop taking your hydrochlorothiazide.  *If you need a refill on your cardiac medications before your next appointment, please call your pharmacy*   Lab Work: None Ordered If you have labs (blood work) drawn today and your tests are completely normal, you will receive your results only by: MyChart Message (if you have MyChart) OR A paper copy in the mail If you have any lab test that is abnormal or we need to change your treatment, we will call you to review the results.   Testing/Procedures: A zio monitor was ordered today. It will remain on for 14 days. Remove 05/26/2023. You will then return monitor and event diary in provided box. It takes 1-2 weeks for report to be downloaded and returned to [place]. We will call you with the results. If monitor falls off or has orange flashing light, please call Zio for further instructions.   Follow-Up: At [facility], you and your health needs are our priority.  As part of our continuing mission to provide you with exceptional heart care, we have created designated Provider Care Teams.  These Care Teams include your primary Cardiologist (physician) and Advanced Practice Providers (APPs -  Physician Assistants and Nurse Practitioners) who all work together to provide you with the care you need, when you need it.  We recommend signing up for the patient portal called "MyChart".  Sign up information is provided on this After Visit Summary.  MyChart is used to connect with patients for Virtual Visits (Telemedicine).  Patients are able to view lab/test results, encounter notes, upcoming appointments, etc.  Non-urgent messages can be sent to your provider as well.   To learn more about what you can do with MyChart, go to [email or web address].    Your next appointment:   3 month follow up

## 2023-05-12 NOTE — Addendum Note (Signed)
Addended by: Lonia Farber on: 05/12/2023 09:54 AM   Modules accepted: Orders

## 2023-05-31 DIAGNOSIS — R42 Dizziness and giddiness: Secondary | ICD-10-CM | POA: Diagnosis not present

## 2023-06-09 DIAGNOSIS — R42 Dizziness and giddiness: Secondary | ICD-10-CM

## 2023-06-21 ENCOUNTER — Telehealth: Payer: Self-pay

## 2023-06-21 MED ORDER — METOPROLOL TARTRATE 25 MG PO TABS: 12.5000 mg | ORAL_TABLET | Freq: Two times a day (BID) | ORAL | 3 refills | Status: DC

## 2023-06-21 NOTE — Telephone Encounter (Signed)
 Monitor Results reviewed with pt as per Dr. Vanetta Shawl note, "start metoprolol tartrate 12.5 twice daily "  Pt verbalized understanding and had no additional questions. Routed to PCP

## 2023-06-26 ENCOUNTER — Telehealth: Payer: Self-pay | Admitting: Cardiology

## 2023-06-26 NOTE — Telephone Encounter (Signed)
 Pt c/o medication issue:  1. Name of Medication: metoprolol tartrate (LOPRESSOR) 25 MG tablet   2. How are you currently taking this medication (dosage and times per day)? Take 0.5 tablets (12.5 mg total) by mouth 2 (two) times daily.   3. Are you having a reaction (difficulty breathing--STAT)?No  4. What is your medication issue? Patient stated she started taking this medication and it has caused her HR to go into the low 40's. Patient stated her fitbit suggested her hr was too low. Patient stated her last dosage of the medication was on Saturday morning. Please advise.

## 2023-06-26 NOTE — Telephone Encounter (Signed)
 Spoke with pt regarding Metoprolol making her HR slow and she is feeling tired. She stopped the medication on Saturday. Advised per Wallis Bamberg, NP that if the medication was bothering her it was ok to stay off of it.

## 2023-08-04 DIAGNOSIS — I10 Essential (primary) hypertension: Secondary | ICD-10-CM | POA: Diagnosis not present

## 2023-08-04 DIAGNOSIS — R202 Paresthesia of skin: Secondary | ICD-10-CM | POA: Diagnosis not present

## 2023-08-04 DIAGNOSIS — M19042 Primary osteoarthritis, left hand: Secondary | ICD-10-CM | POA: Diagnosis not present

## 2023-08-04 DIAGNOSIS — M19041 Primary osteoarthritis, right hand: Secondary | ICD-10-CM | POA: Diagnosis not present

## 2023-08-22 DIAGNOSIS — D485 Neoplasm of uncertain behavior of skin: Secondary | ICD-10-CM | POA: Diagnosis not present

## 2023-08-22 DIAGNOSIS — L820 Inflamed seborrheic keratosis: Secondary | ICD-10-CM | POA: Diagnosis not present

## 2023-08-22 DIAGNOSIS — D225 Melanocytic nevi of trunk: Secondary | ICD-10-CM | POA: Diagnosis not present

## 2023-08-22 DIAGNOSIS — Z1283 Encounter for screening for malignant neoplasm of skin: Secondary | ICD-10-CM | POA: Diagnosis not present

## 2023-08-22 DIAGNOSIS — L308 Other specified dermatitis: Secondary | ICD-10-CM | POA: Diagnosis not present

## 2023-09-04 DIAGNOSIS — L308 Other specified dermatitis: Secondary | ICD-10-CM | POA: Diagnosis not present

## 2023-09-29 DIAGNOSIS — R399 Unspecified symptoms and signs involving the genitourinary system: Secondary | ICD-10-CM | POA: Diagnosis not present

## 2023-09-29 DIAGNOSIS — Z6824 Body mass index (BMI) 24.0-24.9, adult: Secondary | ICD-10-CM | POA: Diagnosis not present

## 2023-09-29 DIAGNOSIS — N390 Urinary tract infection, site not specified: Secondary | ICD-10-CM | POA: Diagnosis not present

## 2023-10-17 DIAGNOSIS — R051 Acute cough: Secondary | ICD-10-CM | POA: Diagnosis not present

## 2023-10-17 DIAGNOSIS — Z03818 Encounter for observation for suspected exposure to other biological agents ruled out: Secondary | ICD-10-CM | POA: Diagnosis not present

## 2023-10-17 DIAGNOSIS — J22 Unspecified acute lower respiratory infection: Secondary | ICD-10-CM | POA: Diagnosis not present

## 2024-02-12 DIAGNOSIS — G319 Degenerative disease of nervous system, unspecified: Secondary | ICD-10-CM | POA: Diagnosis not present

## 2024-03-19 DIAGNOSIS — I10 Essential (primary) hypertension: Secondary | ICD-10-CM | POA: Diagnosis not present

## 2024-03-19 DIAGNOSIS — R7303 Prediabetes: Secondary | ICD-10-CM | POA: Diagnosis not present

## 2024-03-19 DIAGNOSIS — Z0000 Encounter for general adult medical examination without abnormal findings: Secondary | ICD-10-CM | POA: Diagnosis not present

## 2024-03-19 DIAGNOSIS — R202 Paresthesia of skin: Secondary | ICD-10-CM | POA: Diagnosis not present

## 2024-03-19 DIAGNOSIS — I471 Supraventricular tachycardia, unspecified: Secondary | ICD-10-CM | POA: Diagnosis not present

## 2024-07-09 ENCOUNTER — Ambulatory Visit: Admitting: Cardiology
